# Patient Record
Sex: Male | Born: 1945 | Race: White | Hispanic: No | Marital: Married | Smoking: Former smoker
Health system: Southern US, Community
[De-identification: ages and names within clinical notes are randomized; demographics above are authoritative.]

## PROBLEM LIST (undated history)

## (undated) DIAGNOSIS — K5730 Diverticulosis of large intestine without perforation or abscess without bleeding: Secondary | ICD-10-CM

## (undated) DIAGNOSIS — I10 Essential (primary) hypertension: Secondary | ICD-10-CM

## (undated) DIAGNOSIS — N189 Chronic kidney disease, unspecified: Secondary | ICD-10-CM

## (undated) HISTORY — DX: Chronic kidney disease, unspecified: N18.9

## (undated) HISTORY — PX: CYSTOSCOPY: SUR368

## (undated) HISTORY — DX: Diverticulosis of large intestine without perforation or abscess without bleeding: K57.30

## (undated) HISTORY — DX: Essential (primary) hypertension: I10

---
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
## 2001-12-19 ENCOUNTER — Encounter: Payer: Self-pay | Admitting: Internal Medicine

## 2001-12-19 LAB — CONVERTED CEMR LAB

## 2002-11-11 ENCOUNTER — Encounter: Payer: Self-pay | Admitting: Internal Medicine

## 2002-12-26 ENCOUNTER — Encounter: Payer: Self-pay | Admitting: Internal Medicine

## 2005-05-25 ENCOUNTER — Ambulatory Visit: Payer: Self-pay | Admitting: Internal Medicine

## 2006-04-06 ENCOUNTER — Ambulatory Visit: Payer: Self-pay | Admitting: Internal Medicine

## 2006-06-28 ENCOUNTER — Ambulatory Visit: Payer: Self-pay | Admitting: Internal Medicine

## 2007-06-12 ENCOUNTER — Ambulatory Visit: Payer: Self-pay | Admitting: Internal Medicine

## 2007-07-31 ENCOUNTER — Encounter: Payer: Self-pay | Admitting: Internal Medicine

## 2007-07-31 DIAGNOSIS — K5730 Diverticulosis of large intestine without perforation or abscess without bleeding: Secondary | ICD-10-CM | POA: Insufficient documentation

## 2007-07-31 DIAGNOSIS — I10 Essential (primary) hypertension: Secondary | ICD-10-CM

## 2007-07-31 DIAGNOSIS — Z87442 Personal history of urinary calculi: Secondary | ICD-10-CM

## 2008-10-31 ENCOUNTER — Ambulatory Visit: Payer: Self-pay | Admitting: Internal Medicine

## 2008-10-31 LAB — CONVERTED CEMR LAB
ALT: 24 units/L (ref 0–53)
AST: 21 units/L (ref 0–37)
Albumin: 3.9 g/dL (ref 3.5–5.2)
BUN: 10 mg/dL (ref 6–23)
Basophils Absolute: 0.1 10*3/uL (ref 0.0–0.1)
Basophils Relative: 1 % (ref 0.0–3.0)
Bilirubin Urine: NEGATIVE
CO2: 29 meq/L (ref 19–32)
Calcium: 9.5 mg/dL (ref 8.4–10.5)
Cholesterol: 187 mg/dL (ref 0–200)
Creatinine, Ser: 0.8 mg/dL (ref 0.4–1.5)
Eosinophils Absolute: 0.5 10*3/uL (ref 0.0–0.7)
Eosinophils Relative: 5.3 % — ABNORMAL HIGH (ref 0.0–5.0)
Glucose, Urine, Semiquant: NEGATIVE
Hemoglobin: 15.2 g/dL (ref 13.0–17.0)
MCHC: 35.8 g/dL (ref 30.0–36.0)
MCV: 91.3 fL (ref 78.0–100.0)
Neutro Abs: 4.4 10*3/uL (ref 1.4–7.7)
PSA: 3.21 ng/mL (ref 0.10–4.00)
RBC: 4.64 M/uL (ref 4.22–5.81)
Specific Gravity, Urine: 1.025
TSH: 0.91 microintl units/mL (ref 0.35–5.50)
Total Bilirubin: 1.3 mg/dL — ABNORMAL HIGH (ref 0.3–1.2)
WBC: 8.6 10*3/uL (ref 4.5–10.5)
pH: 5.5

## 2008-11-05 ENCOUNTER — Ambulatory Visit: Payer: Self-pay | Admitting: Internal Medicine

## 2009-07-30 ENCOUNTER — Ambulatory Visit: Payer: Self-pay | Admitting: Internal Medicine

## 2009-12-17 ENCOUNTER — Ambulatory Visit: Payer: Self-pay | Admitting: Internal Medicine

## 2009-12-17 LAB — CONVERTED CEMR LAB
ALT: 23 units/L (ref 0–53)
Albumin: 3.6 g/dL (ref 3.5–5.2)
Basophils Relative: 1.3 % (ref 0.0–3.0)
Bilirubin, Direct: 0 mg/dL (ref 0.0–0.3)
CO2: 26 meq/L (ref 19–32)
Chloride: 102 meq/L (ref 96–112)
Creatinine, Ser: 0.8 mg/dL (ref 0.4–1.5)
Eosinophils Absolute: 0.5 10*3/uL (ref 0.0–0.7)
Eosinophils Relative: 5.4 % — ABNORMAL HIGH (ref 0.0–5.0)
HCT: 46 % (ref 39.0–52.0)
Hemoglobin: 15.4 g/dL (ref 13.0–17.0)
Ketones, urine, test strip: NEGATIVE
LDL Cholesterol: 109 mg/dL — ABNORMAL HIGH (ref 0–99)
MCHC: 33.5 g/dL (ref 30.0–36.0)
MCV: 97 fL (ref 78.0–100.0)
Monocytes Absolute: 1 10*3/uL (ref 0.1–1.0)
Neutro Abs: 5.2 10*3/uL (ref 1.4–7.7)
Neutrophils Relative %: 58.1 % (ref 43.0–77.0)
Nitrite: NEGATIVE
PSA: 2.85 ng/mL (ref 0.10–4.00)
Potassium: 4.1 meq/L (ref 3.5–5.1)
RBC: 4.74 M/uL (ref 4.22–5.81)
Sodium: 137 meq/L (ref 135–145)
Specific Gravity, Urine: 1.015
Total CHOL/HDL Ratio: 3
Total Protein: 6.7 g/dL (ref 6.0–8.3)
Triglycerides: 79 mg/dL (ref 0.0–149.0)
Urobilinogen, UA: 1
WBC Urine, dipstick: NEGATIVE
WBC: 9 10*3/uL (ref 4.5–10.5)

## 2009-12-25 ENCOUNTER — Ambulatory Visit: Payer: Self-pay | Admitting: Internal Medicine

## 2009-12-25 DIAGNOSIS — F17200 Nicotine dependence, unspecified, uncomplicated: Secondary | ICD-10-CM | POA: Insufficient documentation

## 2011-01-18 NOTE — Assessment & Plan Note (Signed)
Summary: cpx/njr pt rsc/njr   Vital Signs:  Patient profile:   65 year old male Height:      70 inches Weight:      216 pounds BMI:     31.10 Pulse rate:   76 / minute Resp:     12 per minute BP sitting:   120 / 84  (left arm)  Vitals Entered By: Gladis Riffle, RN (December 25, 2009 9:09 AM)   History of Present Illness: cpx  Preventive Screening-Counseling & Management  Alcohol-Tobacco     Smoking Status: current     Packs/Day: 0.5  Current Problems (verified): 1)  Preventive Health Care  (ICD-V70.0) 2)  Nephrolithiasis, Hx of  (ICD-V13.01) 3)  Hypertension  (ICD-401.9) 4)  Diverticulosis, Colon  (ICD-562.10)  Current Medications (verified): 1)  Lisinopril-Hydrochlorothiazide 20-25 Mg Tabs (Lisinopril-Hydrochlorothiazide) .... Take 1 Tablet By Mouth Once A Day 2)  Saw Palmetto 160 Mg  Caps (Saw Palmetto (Serenoa Repens)) .... Once Daily 3)  Aspirin 81 Mg  Tbec (Aspirin) .... Once Daily 4)  Multivitamins   Caps (Multiple Vitamin) .... Once Daily  Allergies (verified): No Known Drug Allergies  Comments:  Nurse/Medical Assistant: cpx, labs done--needs refill   The patient's medications and allergies were reviewed with the patient and were updated in the Medication and Allergy Lists. Gladis Riffle, RN (December 25, 2009 9:10 AM)  Past History:  Past Medical History: Last updated: 07/31/2007 Diverticulosis, colon Hypertension Nephrolithiasis, hx of  Past Surgical History: Last updated: 07/31/2007 Tonsillectomy, adenoidectomy cystoscopy  Family History: Last updated: 2008/12/02 father deceased--alcohol and cardiac problems  Social History: Last updated: 12-02-08 Occupation: Married Current Smoker Alcohol use-yes Regular exercise-no  Risk Factors: Exercise: no (December 02, 2008)  Risk Factors: Smoking Status: current (12/25/2009) Packs/Day: 0.5 (12/25/2009)  Social History: Packs/Day:  0.5  Review of Systems       All other systems reviewed and were  negative    Impression & Recommendations:  Problem # 1:  PREVENTIVE HEALTH CARE (ICD-V70.0) health maint utd  Problem # 2:  TOBACCO USE (ICD-305.1)  Encouraged smoking cessation and discussed different methods for smoking cessation.  medical information given  Problem # 3:  HYPERTENSION (ICD-401.9) controlled continue current medications  His updated medication list for this problem includes:    Lisinopril-hydrochlorothiazide 20-25 Mg Tabs (Lisinopril-hydrochlorothiazide) .Marland Kitchen... Take 1 tablet by mouth once a day  BP today: 120/84 Prior BP: 128/86 (07/30/2009)  Labs Reviewed: K+: 4.1 (12/17/2009) Creat: : 0.8 (12/17/2009)   Chol: 185 (12/17/2009)   HDL: 60.60 (12/17/2009)   LDL: 109 (12/17/2009)   TG: 79.0 (12/17/2009)  Complete Medication List: 1)  Lisinopril-hydrochlorothiazide 20-25 Mg Tabs (Lisinopril-hydrochlorothiazide) .... Take 1 tablet by mouth once a day 2)  Saw Palmetto 160 Mg Caps (Saw palmetto (serenoa repens)) .... Once daily 3)  Aspirin 81 Mg Tbec (Aspirin) .... Once daily 4)  Multivitamins Caps (Multiple vitamin) .... Once daily  Preventive Care Screening  Colonoscopy:    Next Due:  01/2013  Appended Document: Orders Update    Clinical Lists Changes  Problems: Added new problem of SPECIAL SCREENING FOR MALIGNANT NEOPLASMS COLON (ICD-V76.51) Orders: Added new Referral order of Gastroenterology Referral (GI) - Signed

## 2011-02-22 ENCOUNTER — Other Ambulatory Visit: Payer: Self-pay | Admitting: Internal Medicine

## 2011-04-05 ENCOUNTER — Other Ambulatory Visit (INDEPENDENT_AMBULATORY_CARE_PROVIDER_SITE_OTHER): Payer: Managed Care, Other (non HMO) | Admitting: Internal Medicine

## 2011-04-05 DIAGNOSIS — Z0000 Encounter for general adult medical examination without abnormal findings: Secondary | ICD-10-CM

## 2011-04-05 LAB — HEPATIC FUNCTION PANEL
ALT: 20 U/L (ref 0–53)
AST: 22 U/L (ref 0–37)
Bilirubin, Direct: 0.2 mg/dL (ref 0.0–0.3)
Total Bilirubin: 0.9 mg/dL (ref 0.3–1.2)

## 2011-04-05 LAB — POCT URINALYSIS DIPSTICK
Leukocytes, UA: NEGATIVE
Nitrite, UA: NEGATIVE
Protein, UA: NEGATIVE
Urobilinogen, UA: 1

## 2011-04-05 LAB — PSA: PSA: 2.47 ng/mL (ref 0.10–4.00)

## 2011-04-05 LAB — LIPID PANEL
Cholesterol: 185 mg/dL (ref 0–200)
LDL Cholesterol: 104 mg/dL — ABNORMAL HIGH (ref 0–99)
Total CHOL/HDL Ratio: 3

## 2011-04-05 LAB — BASIC METABOLIC PANEL
CO2: 30 mEq/L (ref 19–32)
Calcium: 9.5 mg/dL (ref 8.4–10.5)
Creatinine, Ser: 0.9 mg/dL (ref 0.4–1.5)
GFR: 91.44 mL/min (ref >60.00)
Glucose, Bld: 98 mg/dL (ref 70–99)

## 2011-04-05 LAB — CBC WITH DIFFERENTIAL/PLATELET
Basophils Absolute: 0.1 10*3/uL (ref 0.0–0.1)
Eosinophils Absolute: 0.3 10*3/uL (ref 0.0–0.7)
Lymphocytes Relative: 20.3 % (ref 12.0–46.0)
MCHC: 34.6 g/dL (ref 30.0–36.0)
Monocytes Relative: 8.5 % (ref 3.0–12.0)
Neutrophils Relative %: 67.5 % (ref 43.0–77.0)
RBC: 4.92 Mil/uL (ref 4.22–5.81)
RDW: 13.4 % (ref 11.5–14.6)

## 2011-04-11 ENCOUNTER — Encounter: Payer: Self-pay | Admitting: Internal Medicine

## 2011-04-12 ENCOUNTER — Encounter: Payer: Self-pay | Admitting: Internal Medicine

## 2011-04-12 ENCOUNTER — Ambulatory Visit (INDEPENDENT_AMBULATORY_CARE_PROVIDER_SITE_OTHER): Payer: Managed Care, Other (non HMO) | Admitting: Internal Medicine

## 2011-04-12 DIAGNOSIS — I10 Essential (primary) hypertension: Secondary | ICD-10-CM

## 2011-04-12 DIAGNOSIS — Z2911 Encounter for prophylactic immunotherapy for respiratory syncytial virus (RSV): Secondary | ICD-10-CM

## 2011-04-12 DIAGNOSIS — F17200 Nicotine dependence, unspecified, uncomplicated: Secondary | ICD-10-CM

## 2011-04-12 MED ORDER — VARENICLINE TARTRATE 0.5 MG X 11 & 1 MG X 42 PO MISC: ORAL | Status: AC

## 2011-04-12 MED ORDER — VARENICLINE TARTRATE 1 MG PO TABS: 1.0000 mg | ORAL_TABLET | Freq: Two times a day (BID) | ORAL | Status: AC

## 2011-04-12 NOTE — Progress Notes (Signed)
  Subjective:    Patient ID: Trevor [name], male    DOB: 08-May-1946, 65 y.o.   MRN: [id number]  HPI cpx  Past Medical History  Diagnosis Date  . Diverticulosis of colon   . Hypertension   . Chronic kidney disease     kidney stone   Past Surgical History  Procedure Date  . Tonsilectomy, adenoidectomy, bilateral myringotomy and tubes   . Cystoscopy     reports that he has been smoking Cigarettes.  He has been smoking about .5 packs per day. He does not have any smokeless tobacco history on file. He reports that he drinks alcohol. He reports that he does not use illicit drugs. family history includes Alcohol abuse in his father; Heart disease in his father; Hyperlipidemia in his mother; and Osteoporosis in his mother. No Known Allergies   Review of Systems  patient denies chest pain, shortness of breath, orthopnea. Denies lower extremity edema, abdominal pain, change in appetite, change in bowel movements. Patient denies rashes, musculoskeletal complaints. No other specific complaints in a complete review of systems.      Objective:   Physical Exam Well-developed male in no acute distress. HEENT exam atraumatic, normocephalic, extraocular muscles are intact. Conjunctivae are pink without exudate. Neck is supple without lymphadenopathy, thyromegaly, jugular venous distention. Chest is clear to auscultation without increased work of breathing. Cardiac exam S1-S2 are regular. The PMI is normal. No significant murmurs or gallops. Abdominal exam active bowel sounds, soft, nontender. No abdominal bruits. Extremities no clubbing cyanosis or edema. Peripheral pulses are normal without bruits. Neurologic exam alert and oriented without any motor or sensory deficits. Rectal exam normal tone prostate normal size without masses or asymmetry.     Assessment & Plan:  Well Visit---health maint utd

## 2011-04-12 NOTE — Patient Instructions (Signed)
Smoking Cessation This document explains the best ways for you to quit smoking and new treatments to help. It lists new medicines that can double or triple your chances of quitting and quitting for good. It also considers ways to avoid relapses and concerns you may have about quitting, including weight gain. NICOTINE: A POWERFUL ADDICTION If you have tried to quit smoking, you know how hard it can be. It is hard because nicotine is a very addictive drug. For some people, it can be as addictive as heroin or cocaine. Usually, people make 2 or 3 tries, or more, before finally being able to quit. Each time you try to quit, you can learn about what helps and what hurts. Quitting takes hard work and a lot of effort, but you can quit smoking. QUITTING SMOKING IS ONE OF THE MOST IMPORTANT THINGS YOU WILL EVER DO:  You will live longer, feel better, and live better.   The impact on your body of quitting smoking is felt almost immediately:   Within 20 minutes, blood pressure decreases. Pulse returns to its normal level.   After 8 hours, carbon monoxide levels in the blood return to normal. Oxygen level increases.   After 24 hours, chance of heart attack starts to decrease. Breath, hair, and body stop smelling like smoke.   After 48 hours, damaged nerve endings begin to recover. Sense of taste and smell improve.   After 72 hours, the body is virtually free of nicotine. Bronchial tubes relax and breathing becomes easier.   After 2 to 12 weeks, lungs can hold more air. Exercise becomes easier and circulation improves.   Quitting will lower your chance of having a heart attack, stroke, cancer, or lung disease:   After 1 year, the risk of coronary heart disease is cut in half.   After 5 years, the risk of stroke falls to the same as a nonsmoker.   After 10 years, the risk of lung cancer is cut in half and the risk of other cancers decreases significantly.   After 15 years, the risk of coronary heart  disease drops, usually to the level of a nonsmoker.   If you are pregnant, quitting smoking will improve your chances of having a healthy baby.   The people you live with, especially your children, will be healthier.   You will have extra money to spend on things other than cigarettes.  FIVE KEYS TO QUITTING Studies have shown that these 5 steps will help you quit smoking and quit for good. You have the best chances of quitting if you use them together: 1. Get ready.  2. Get support and encouragement.  3. Learn new skills and behaviors.  4. Get medicine to reduce your nicotine addiction and use it correctly.  5. Be prepared for relapse or difficult situations. Be determined to continue trying to quit, even if you do not succeed at first.  1. GET READY  Set a quit date.   Change your environment.   Get rid of ALL cigarettes, ashtrays, matches, and lighters in your home, car, and place of work.   Do not let people smoke in your home.   Review your past attempts to quit. Think about what worked and what did not.   Once you quit, do not smoke. NOT EVEN A PUFF!  2. GET SUPPORT AND ENCOURAGEMENT Studies have shown that you have a better chance of being successful if you have help. You can get support in many ways.  Tell   your family, friends, and coworkers that you are going to quit and need their support. Ask them not to smoke around you.   Talk to your caregivers (doctor, dentist, nurse, pharmacist, psychologist, and/or smoking counselor).   Get individual, group, or telephone counseling and support. The more counseling you have, the better your chances are of quitting. Programs are available at local hospitals and health centers. Call your local health department for information about programs in your area.   Spiritual beliefs and practices may help some smokers quit.   Quit meters are small computer programs online or downloadable that keep track of quit statistics, such as amount  of "quit-time," cigarettes not smoked, and money saved.   Many smokers find one or more of the many self-help books available useful in helping them quit and stay off tobacco.  3. LEARN NEW SKILLS AND BEHAVIORS  Try to distract yourself from urges to smoke. Talk to someone, go for a walk, or occupy your time with a task.   When you first try to quit, change your routine. Take a different route to work. Drink tea instead of coffee. Eat breakfast in a different place.   Do something to reduce your stress. Take a hot bath, exercise, or read a book.   Plan something enjoyable to do every day. Reward yourself for not smoking.   Explore interactive web-based programs that specialize in helping you quit.  4. GET MEDICINE AND USE IT CORRECTLY Medicines can help you stop smoking and decrease the urge to smoke. Combining medicine with the above behavioral methods and support can quadruple your chances of successfully quitting smoking. The [place] Food and Drug Administration (FDA) has approved 7 medicines to help you quit smoking. These medicines fall into 3 categories.  Nicotine replacement therapy (delivers nicotine to your body without the negative effects and risks of smoking):   Nicotine gum: Available over-the-counter.   Nicotine lozenges: Available over-the-counter.   Nicotine inhaler: Available by prescription.   Nicotine nasal spray: Available by prescription.   Nicotine skin patches (transdermal): Available by prescription and over-the-counter.   Antidepressant medicine (helps people abstain from smoking, but how this works is unknown):   Bupropion sustained-release (SR) tablets: Available by prescription.   Nicotinic receptor partial agonist (simulates the effect of nicotine in your brain):   Varenicline tartrate tablets: Available by prescription.   Ask your caregiver for advice about which medicines to use and how to use them. Carefully read the information on the package.    Everyone who is trying to quit may benefit from using a medicine. If you are pregnant or trying to become pregnant, nursing an infant, you are under age 18, or you smoke fewer than 10 cigarettes per day, talk to your caregiver before taking any nicotine replacement medicines.   You should stop using a nicotine replacement product and call your caregiver if you experience nausea, dizziness, weakness, vomiting, fast or irregular heartbeat, mouth problems with the lozenge or gum, or redness or swelling of the skin around the patch that does not go away.   Do not use any other product containing nicotine while using a nicotine replacement product.   Talk to your caregiver before using these products if you have diabetes, heart disease, asthma, stomach ulcers, you had a recent heart attack, you have high blood pressure that is not controlled with medicine, a history of irregular heartbeat, or you have been prescribed medicine to help you quit smoking.  5. BE PREPARED FOR RELAPSE OR   DIFFICULT SITUATIONS  Most relapses occur within the first 3 months after quitting. Do not be discouraged if you start smoking again. Remember, most people try several times before they finally quit.   You may have symptoms of withdrawal because your body is used to nicotine. You may crave cigarettes, be irritable, feel very hungry, cough often, get headaches, or have difficulty concentrating.   The withdrawal symptoms are only temporary. They are strongest when you first quit, but they will go away within 10 to 14 days.  Here are some difficult situations to watch for:  Alcohol. Avoid drinking alcohol. Drinking lowers your chances of successfully quitting.   Caffeine. Try to reduce the amount of caffeine you consume. It also lowers your chances of successfully quitting.   Other smokers. Being around smoking can make you want to smoke. Avoid smokers.   Weight gain. Many smokers will gain weight when they quit, usually  less than 10 pounds. Eat a healthy diet and stay active. Do not let weight gain distract you from your main goal, quitting smoking. Some medicines that help you quit smoking may also help delay weight gain. You can always lose the weight gained after you quit.   Bad mood or depression. There are a lot of ways to improve your mood other than smoking.  If you are having problems with any of these situations, talk to your caregiver. SPECIAL SITUATIONS OR CONDITIONS Studies suggest that everyone can quit smoking. Your situation or condition can give you a special reason to quit.  Pregnant women/New mothers: By quitting, you protect your baby's health and your own.   Hospitalized patients: By quitting, you reduce health problems and help healing.   Heart attack patients: By quitting, you reduce your risk of a second heart attack.   Lung, head, and neck cancer patients: By quitting, you reduce your chance of a second cancer.   Parents of children and adolescents: By quitting, you protect your children from illnesses caused by secondhand smoke.  QUESTIONS TO THINK ABOUT Think about the following questions before you try to stop smoking. You may want to talk about your answers with your caregiver.  Why do you want to quit?   If you tried to quit in the past, what helped and what did not?   What will be the most difficult situations for you after you quit? How will you plan to handle them?   Who can help you through the tough times? Your family? Friends? Caregiver?   What pleasures do you get from smoking? What ways can you still get pleasure if you quit?  Here are some questions to ask your caregiver:  How can you help me to be successful at quitting?   What medicine do you think would be best for me and how should I take it?   What should I do if I need more help?   What is smoking withdrawal like? How can I get information on withdrawal?  Quitting takes hard work and a lot of effort,  but you can quit smoking. FOR MORE INFORMATION Smokefree.gov (http://www.smokefree.gov) provides free, accurate, evidence-based information and professional assistance to help support the immediate and long-term needs of people trying to quit smoking. Document Released: 11/29/2001 Document Re-Released: 05/25/2010 [employer] Patient Information 2011 [employer]. 

## 2011-04-12 NOTE — Assessment & Plan Note (Signed)
Controlled Continue same meds 

## 2011-04-12 NOTE — Assessment & Plan Note (Signed)
Discussed need for cessation Information given regarding chantix Rx given

## 2012-03-15 ENCOUNTER — Other Ambulatory Visit: Payer: Self-pay | Admitting: Internal Medicine

## 2012-04-10 ENCOUNTER — Other Ambulatory Visit (INDEPENDENT_AMBULATORY_CARE_PROVIDER_SITE_OTHER): Payer: Managed Care, Other (non HMO)

## 2012-04-10 DIAGNOSIS — Z0000 Encounter for general adult medical examination without abnormal findings: Secondary | ICD-10-CM

## 2012-04-10 LAB — POCT URINALYSIS DIPSTICK
Glucose, UA: NEGATIVE
Leukocytes, UA: NEGATIVE
Nitrite, UA: NEGATIVE

## 2012-04-10 LAB — CBC WITH DIFFERENTIAL/PLATELET
Basophils Relative: 0.6 % (ref 0.0–3.0)
Eosinophils Absolute: 0.3 10*3/uL (ref 0.0–0.7)
Hemoglobin: 16.1 g/dL (ref 13.0–17.0)
Lymphocytes Relative: 18.2 % (ref 12.0–46.0)
MCHC: 33.5 g/dL (ref 30.0–36.0)
MCV: 96.1 fl (ref 78.0–100.0)
Monocytes Absolute: 1 10*3/uL (ref 0.1–1.0)
Neutro Abs: 9.3 10*3/uL — ABNORMAL HIGH (ref 1.4–7.7)
RBC: 5 Mil/uL (ref 4.22–5.81)

## 2012-04-10 LAB — BASIC METABOLIC PANEL
CO2: 28 mEq/L (ref 19–32)
Chloride: 97 mEq/L (ref 96–112)
Sodium: 136 mEq/L (ref 135–145)

## 2012-04-10 LAB — HEPATIC FUNCTION PANEL
Albumin: 4.2 g/dL (ref 3.5–5.2)
Alkaline Phosphatase: 67 U/L (ref 39–117)
Total Protein: 6.8 g/dL (ref 6.0–8.3)

## 2012-04-10 LAB — LIPID PANEL
Total CHOL/HDL Ratio: 3
Triglycerides: 83 mg/dL (ref 0.0–149.0)

## 2012-04-10 LAB — PSA: PSA: 3.38 ng/mL (ref 0.10–4.00)

## 2012-04-17 ENCOUNTER — Encounter: Payer: Managed Care, Other (non HMO) | Admitting: Internal Medicine

## 2012-04-18 ENCOUNTER — Ambulatory Visit (INDEPENDENT_AMBULATORY_CARE_PROVIDER_SITE_OTHER)
Admission: RE | Admit: 2012-04-18 | Discharge: 2012-04-18 | Disposition: A | Discharge status: Home / Self Care | Payer: Managed Care, Other (non HMO) | Source: Ambulatory Visit | Attending: Internal Medicine | Admitting: Internal Medicine

## 2012-04-18 ENCOUNTER — Ambulatory Visit (INDEPENDENT_AMBULATORY_CARE_PROVIDER_SITE_OTHER): Payer: Managed Care, Other (non HMO) | Admitting: Internal Medicine

## 2012-04-18 ENCOUNTER — Encounter: Payer: Self-pay | Admitting: Internal Medicine

## 2012-04-18 VITALS — BP 154/98 | HR 88 | Temp 98.1°F | Ht 70.0 in | Wt 198.0 lb

## 2012-04-18 DIAGNOSIS — R634 Abnormal weight loss: Secondary | ICD-10-CM

## 2012-04-18 DIAGNOSIS — Z23 Encounter for immunization: Secondary | ICD-10-CM

## 2012-04-18 DIAGNOSIS — D72829 Elevated white blood cell count, unspecified: Secondary | ICD-10-CM

## 2012-04-18 DIAGNOSIS — Z0000 Encounter for general adult medical examination without abnormal findings: Secondary | ICD-10-CM

## 2012-04-18 DIAGNOSIS — E8881 Metabolic syndrome: Secondary | ICD-10-CM

## 2012-04-18 HISTORY — PX: CATARACT EXTRACTION W/ INTRAOCULAR LENS IMPLANT: SHX1309

## 2012-04-18 LAB — HIGH SENSITIVITY CRP: CRP, High Sensitivity: <0.2 mg/L (ref 0.000–5.000)

## 2012-04-18 LAB — SEDIMENTATION RATE: Sed Rate: 3 mm/hr (ref 0–22)

## 2012-04-18 MED ORDER — LISINOPRIL-HYDROCHLOROTHIAZIDE 20-25 MG PO TABS: 1.0000 | ORAL_TABLET | Freq: Every day | ORAL | Status: DC

## 2012-04-18 NOTE — Progress Notes (Signed)
Patient ID: Trevor [name], male   DOB: 05-Sep-1946, 66 y.o.   MRN: [id number] cpx  New complaint--- one year hx of decreased appetite-- no specific pain or other sxs with eating. Food can be "gagging". He also notes that he has 2-3 bms per day and they are typically "loose". This has been a change over the past 1 year as well.  Past Medical History  Diagnosis Date  . Diverticulosis of colon   . Hypertension   . Chronic kidney disease     kidney stone    History   Social History  . Marital Status: Married    Spouse Name: N/A    Number of Children: N/A  . Years of Education: N/A   Occupational History  . Not on file.   Social History Main Topics  . Smoking status: Current Everyday Smoker -- 0.5 packs/day    Types: Cigarettes  . Smokeless tobacco: Not on file  . Alcohol Use: Yes  . Drug Use: No  . Sexually Active:    Other Topics Concern  . Not on file   Social History Narrative  . No narrative on file    Past Surgical History  Procedure Date  . Tonsilectomy, adenoidectomy, bilateral myringotomy and tubes   . Cystoscopy     Family History  Problem Relation Age of Onset  . Alcohol abuse Father   . Heart disease Father   . Hyperlipidemia Mother   . Osteoporosis Mother     No Known Allergies  Current Outpatient Prescriptions on File Prior to Visit  Medication Sig Dispense Refill  . aspirin 81 MG tablet Take 81 mg by mouth daily.        Marland Kitchen lisinopril-hydrochlorothiazide (PRINZIDE,ZESTORETIC) 20-25 MG per tablet take 1 tablet by mouth once daily  90 tablet  11  . Multiple Vitamin (MULTIVITAMIN) tablet Take 1 tablet by mouth daily.        . saw palmetto 160 MG capsule Take 160 mg by mouth 2 (two) times daily.           patient denies chest pain, shortness of breath, orthopnea. Denies lower extremity edema, abdominal pain, change in appetite, change in bowel movements. Patient denies rashes, musculoskeletal complaints. No other specific complaints in a complete  review of systems.   BP 154/98  Pulse 88  Temp(Src) 98.1 F (36.7 C) (Oral)  Ht 5\' 10"  (1.778 m)  Wt 198 lb (89.812 kg)  BMI 28.41 kg/m2  well-developed well-nourished male in no acute distress. HEENT exam atraumatic, normocephalic, neck supple without jugular venous distention. Chest clear to auscultation cardiac exam S1-S2 are regular. Abdominal exam overweight with bowel sounds, soft and nontender. Extremities no edema. Neurologic exam is alert with a normal gait.   Assessment and plan: Well visit, health maintenance up-to-date.

## 2012-04-20 ENCOUNTER — Other Ambulatory Visit: Payer: Self-pay | Admitting: Internal Medicine

## 2012-04-20 DIAGNOSIS — R634 Abnormal weight loss: Secondary | ICD-10-CM

## 2012-05-02 ENCOUNTER — Telehealth: Payer: Self-pay | Admitting: Gastroenterology

## 2012-05-02 NOTE — Telephone Encounter (Signed)
Pt states that he has been loosing weight for quite some time and has no appetite. States he saw Dr. Cato Mulligan and was supposed to be set up for a colon asap. Pt is due for colon recall in Jan of 2014. Pt scheduled to see Mike Gip PA tomorrow at 11am. Pt aware of appt date and time.

## 2012-05-03 ENCOUNTER — Telehealth: Payer: Self-pay | Admitting: Internal Medicine

## 2012-05-03 ENCOUNTER — Encounter: Payer: Self-pay | Admitting: Physician Assistant

## 2012-05-03 ENCOUNTER — Ambulatory Visit (INDEPENDENT_AMBULATORY_CARE_PROVIDER_SITE_OTHER): Payer: Managed Care, Other (non HMO) | Admitting: Physician Assistant

## 2012-05-03 VITALS — BP 110/80 | HR 75 | Ht 71.0 in | Wt 189.4 lb

## 2012-05-03 DIAGNOSIS — R194 Change in bowel habit: Secondary | ICD-10-CM

## 2012-05-03 DIAGNOSIS — R198 Other specified symptoms and signs involving the digestive system and abdomen: Secondary | ICD-10-CM

## 2012-05-03 DIAGNOSIS — R634 Abnormal weight loss: Secondary | ICD-10-CM

## 2012-05-03 DIAGNOSIS — R6881 Early satiety: Secondary | ICD-10-CM

## 2012-05-03 MED ORDER — LISINOPRIL-HYDROCHLOROTHIAZIDE 20-25 MG PO TABS: 1.0000 | ORAL_TABLET | Freq: Every day | ORAL | Status: DC

## 2012-05-03 NOTE — Progress Notes (Signed)
Subjective:    Patient ID: Trevor [name], male    DOB: 1946-04-25, 66 y.o.   MRN: [id number]  HPI Trevor [name] is a pleasant 66 year old white male known to Dr. Terrial Rhodes previously on colonoscopy done in 2004. This showed sigmoid diverticulosis but was otherwise negative exam. Patient does have history of hypertension and has COPD noted on recent chest x-ray. He has been otherwise generally healthy .  Patient comes in today referred by Dr. Cato Mulligan for evaluation of weight loss.. Patient relates that he is lost about 28 pounds over the past year. He feels that the weight loss started early last fall. He is also noted a change in his bowel habits with loose her stools intermittently over the past 6-7 months. He denies having diarrhea has not noted any melena or hematochezia. He says his appetite has been poor of note he still gets hungry. He says he starts eating and just does not want any more. He says there are several foods that he used to like that he just now had has no appetite for at this point. He denies any dysphagia or odynophagia, has not had any nausea or vomiting, he does have occasional heartburn. He has not noted any abdominal pain. Overall he says he feels mildly fatigued. He has No complaint of night sweats. He denies any arthralgias, myalgias or urinary symptoms. He is a smoker.  Patient has had recent labs done in April of 2013 oh WBC was 13,000 hemoglobin was 16 TSH 0.85 and seem that completely unremarkable. He has not had any other imaging.  Review of Systems  Constitutional: Positive for appetite change and fatigue.  HENT: Negative.   Eyes: Negative.   Respiratory: Negative.   Cardiovascular: Negative.   Gastrointestinal: Positive for diarrhea.  Genitourinary: Negative.   Musculoskeletal: Negative.   Skin: Negative.   Neurological: Negative.   Hematological: Negative.   Psychiatric/Behavioral: Negative.    Outpatient Prescriptions Prior to Visit  Medication Sig Dispense  Refill  . aspirin 81 MG tablet Take 81 mg by mouth daily.        Marland Kitchen lisinopril-hydrochlorothiazide (PRINZIDE,ZESTORETIC) 20-25 MG per tablet Take 1 tablet by mouth daily.  90 tablet  3  . Multiple Vitamin (MULTIVITAMIN) tablet Take 1 tablet by mouth daily.        . saw palmetto 160 MG capsule Take 160 mg by mouth 2 (two) times daily.         No Known Allergies     Patient Active Problem List  Diagnoses  . TOBACCO USE  . HYPERTENSION  . DIVERTICULOSIS, COLON  . NEPHROLITHIASIS, HX OF  . Metabolic syndrome X   History   Social History  . Marital Status: Married    Spouse Name: N/A    Number of Children: 5  . Years of Education: N/A   Occupational History  . retired    Social History Main Topics  . Smoking status: Current Everyday Smoker -- 0.5 packs/day for 45 years    Types: Cigarettes  . Smokeless tobacco: Never Used   Comment: Tobacco info given 05/03/12  . Alcohol Use: Yes     mod.  . Drug Use: No  . Sexually Active: Not on file   Other Topics Concern  . Not on file   Social History Narrative  . No narrative on file   Pertinent positive and negative review of systems were noted in the above HPI section.  All other review of systems was otherwise negative.  Objective:  Physical Exam well-developed white male in no acute distress, blood pressure 110/80 pulse 75 height 5 foot 11 weight 189. HEENT; nontraumatic normocephalic EOMI PERRLA sclera anicteric conjunctiva pink,neck; Supple no JVD no palpable cervical adenopathy. Cardiovascular; regular rate and rhythm with S1-S2 no murmur or gallop, Pulmonary; few scattered rhonchi, Abdomen; soft somewhat scaphoid is nontender there is no palpable mass or hepatosplenomegaly bowel sounds are active no bruit, Rectal; exam not done, Ext; no clubbing cyanosis or edema skin warm and dry, Psych mood and affect normal and appropriate.        Assessment & Plan:  #21 66 year old male with significant weight loss of 20 pounds over  the past one year and associated early satiety and anorexia. He has had a very nonspecific change in bowel habits with looser stools appear. He also complains of fatigue. Etiology of his symptoms is not clear, however am concerned about  an occult malignancy.. Rule out possible gastric lesion or pancreatic lesion. #2 cataracts-Asian is scheduled for cataract surgery 05/08/2012 #3 hypertension #4 diverticulosis #5 smoker Plan; scheduled for CT scan of the abdomen and pelvis next week. Patient wants to wait until after he recovers from his cataract surgery to proceed with any endoscopic workup and that is reasonable. Have discussed the need for colonoscopy and upper endoscopy. Procedures discussed in detail with the patient and he is agreeable to proceed. We will schedule with Dr. Marina Goodell within the next few weeks.

## 2012-05-03 NOTE — Progress Notes (Signed)
Case discussed with physician assistant. Agree with the assessment and plans as outlined. Amy to followup on CT scan results

## 2012-05-03 NOTE — Telephone Encounter (Signed)
NOtified pt's wife and she states [facility] wanted [place] to call rx in as they just activated his account. Rx called to [facility], group # [id number].

## 2012-05-03 NOTE — Telephone Encounter (Signed)
Refill sent to [facility] per 04/18/12 documentation #90 x 3 refills.

## 2012-05-03 NOTE — Telephone Encounter (Signed)
Spoke with Liborio Nixon at [facility] and was told that pt's account is inactive. Notified pt's wife, Sedalia Muta and she states she was told yesterday that he is active and no rxs have been received. Advised her to speak to [facility] again to "activate" pt's account and call us back if we need to re-send Rx.

## 2012-05-03 NOTE — Telephone Encounter (Signed)
Pt now uses [facility] lisinopril-hydrochlorothiazide (PRINZIDE,ZESTORETIC) 20-25 MG per tablet to [facility] fax # [phone].

## 2012-05-03 NOTE — Patient Instructions (Signed)
We will contact you regarding the colonoscopy.   We have scheduled the CT scan at [facility], [address].  You are scheduled on 05-15-2012 at 9:00 Am . You should arrive at 8:45 Am. Please follow the written instructions below on the day of your exam:  WARNING: IF YOU ARE ALLERGIC TO IODINE/X-RAY DYE, PLEASE NOTIFY RADIOLOGY IMMEDIATELY AT [phone]! YOU WILL BE GIVEN A 13 HOUR PREMEDICATION PREP.  1) Do not eat or drink anything after 5:00 Am  (4 hours prior to your test) 2) You have been given 2 bottles of oral contrast to drink. The solution may taste  better if refrigerated, but do NOT add ice or any other liquid to this solution. Shake well before drinking.    Drink 1 bottle of contrast @ 7:00 Am  (2 hours prior to your exam)  Drink 1 bottle of contrast @ 8:00 AM 1 hour prior to your exam)  You may take any medications as prescribed with a small amount of water except for the following: Metformin, Glucophage, Glucovance, Avandamet, Riomet, Fortamet, Actoplus Met, Janumet, Glumetza or Metaglip. The above medications must be held the day of the exam AND 48 hours after the exam.  The purpose of you drinking the oral contrast is to aid in the visualization of your intestinal tract. The contrast solution may cause some diarrhea. Before your exam is started, you will be given a small amount of fluid to drink. Depending on your individual set of symptoms, you may also receive an intravenous injection of x-ray contrast/dye. Plan on being at [facility] for 30 minutes or long, depending on the type of exam you are having performed.  If you have any questions regarding your exam or if you need to reschedule, you may call the CT department at [phone] between the hours of 8:00 am and 5:00 pm, Monday-Friday.  ________________________________________________________________________

## 2012-05-03 NOTE — Telephone Encounter (Signed)
Pt wife called back and just called [facility] and said that her husband acct is now active and the script call be called in to Phone # [phone] press Opition #2 and order the Lisinopril.

## 2012-05-09 ENCOUNTER — Telehealth: Payer: Self-pay | Admitting: Internal Medicine

## 2012-05-09 NOTE — Telephone Encounter (Signed)
I called the pt to apologize for the hospital calling him trying to confirm his procedure we originally scheduled for 05-18-2012.  We cancelled it in the office but I didn't cancel it at the hospital. He had a scheduling conflict with his wife and her work schedule.   He said his wife may be able to get off whatever day we schedule the procedure now.  I told him Amy said that we will see what the CT shows and then go from there for scheduling the colonoscopy/Endoscopy.

## 2012-05-09 NOTE — Telephone Encounter (Signed)
Pam please check on this and discuss with pt. He states he did not know anything about his procedure appt on 05/18/12 with Dr. Marina Goodell. Pt saw Amy Esterwood PA 05/03/12. I do not know about this pt.

## 2012-05-10 ENCOUNTER — Encounter (HOSPITAL_COMMUNITY): Admission: RE | Payer: Self-pay | Source: Ambulatory Visit

## 2012-05-10 SURGERY — EGD (ESOPHAGOGASTRODUODENOSCOPY)
Anesthesia: Moderate Sedation

## 2012-05-12 ENCOUNTER — Ambulatory Visit (HOSPITAL_COMMUNITY)
Admission: RE | Admit: 2012-05-12 | Payer: Managed Care, Other (non HMO) | Source: Ambulatory Visit | Admitting: Internal Medicine

## 2012-05-15 ENCOUNTER — Ambulatory Visit (INDEPENDENT_AMBULATORY_CARE_PROVIDER_SITE_OTHER)
Admission: RE | Admit: 2012-05-15 | Discharge: 2012-05-15 | Disposition: A | Discharge status: Home / Self Care | Payer: Managed Care, Other (non HMO) | Source: Ambulatory Visit | Attending: Physician Assistant | Admitting: Physician Assistant

## 2012-05-15 DIAGNOSIS — R634 Abnormal weight loss: Secondary | ICD-10-CM

## 2012-05-15 DIAGNOSIS — R194 Change in bowel habit: Secondary | ICD-10-CM

## 2012-05-15 DIAGNOSIS — R6881 Early satiety: Secondary | ICD-10-CM

## 2012-05-15 DIAGNOSIS — R198 Other specified symptoms and signs involving the digestive system and abdomen: Secondary | ICD-10-CM

## 2012-05-15 MED ORDER — IOHEXOL 300 MG/ML  SOLN
100.0000 mL | Freq: Once | INTRAMUSCULAR | Status: AC | PRN
  Administered 2012-05-15: 100 mL via INTRAVENOUS

## 2012-05-18 ENCOUNTER — Encounter (HOSPITAL_COMMUNITY): Admission: RE | Payer: Self-pay | Source: Ambulatory Visit

## 2012-05-18 SURGERY — EGD (ESOPHAGOGASTRODUODENOSCOPY)
Anesthesia: Moderate Sedation

## 2012-05-21 ENCOUNTER — Encounter: Payer: Managed Care, Other (non HMO) | Admitting: Internal Medicine

## 2012-05-31 ENCOUNTER — Ambulatory Visit (AMBULATORY_SURGERY_CENTER): Payer: Managed Care, Other (non HMO) | Admitting: *Deleted

## 2012-05-31 VITALS — Ht 71.0 in | Wt 190.0 lb

## 2012-05-31 DIAGNOSIS — R198 Other specified symptoms and signs involving the digestive system and abdomen: Secondary | ICD-10-CM

## 2012-05-31 DIAGNOSIS — R634 Abnormal weight loss: Secondary | ICD-10-CM

## 2012-05-31 DIAGNOSIS — Z1211 Encounter for screening for malignant neoplasm of colon: Secondary | ICD-10-CM

## 2012-05-31 MED ORDER — MOVIPREP 100 G PO SOLR: ORAL | Status: DC

## 2012-06-15 ENCOUNTER — Ambulatory Visit (AMBULATORY_SURGERY_CENTER): Payer: Managed Care, Other (non HMO) | Admitting: Internal Medicine

## 2012-06-15 ENCOUNTER — Encounter: Payer: Self-pay | Admitting: Internal Medicine

## 2012-06-15 VITALS — BP 130/93 | HR 82 | Temp 98.8°F | Resp 20 | Ht 71.0 in | Wt 190.0 lb

## 2012-06-15 DIAGNOSIS — K222 Esophageal obstruction: Secondary | ICD-10-CM

## 2012-06-15 DIAGNOSIS — D126 Benign neoplasm of colon, unspecified: Secondary | ICD-10-CM

## 2012-06-15 DIAGNOSIS — R634 Abnormal weight loss: Secondary | ICD-10-CM

## 2012-06-15 DIAGNOSIS — R198 Other specified symptoms and signs involving the digestive system and abdomen: Secondary | ICD-10-CM

## 2012-06-15 DIAGNOSIS — Z1211 Encounter for screening for malignant neoplasm of colon: Secondary | ICD-10-CM

## 2012-06-15 DIAGNOSIS — K5730 Diverticulosis of large intestine without perforation or abscess without bleeding: Secondary | ICD-10-CM

## 2012-06-15 DIAGNOSIS — K219 Gastro-esophageal reflux disease without esophagitis: Secondary | ICD-10-CM

## 2012-06-15 MED ORDER — SODIUM CHLORIDE 0.9 % IV SOLN: 500.0000 mL | INTRAVENOUS | Status: DC

## 2012-06-15 NOTE — Progress Notes (Signed)
Patient did not experience any of the following events: a burn prior to discharge; a fall within the facility; wrong site/side/patient/procedure/implant event; or a hospital transfer or hospital admission upon discharge from the facility. (G8907) Patient did not have preoperative order for IV antibiotic SSI prophylaxis. (G8918)  

## 2012-06-15 NOTE — Patient Instructions (Addendum)

## 2012-06-15 NOTE — Op Note (Signed)
[facility] [address] [place], [place]  [address]  ENDOSCOPY PROCEDURE REPORT  [name]:  Trevor, [name]  MR#:  [id number] BIRTHDATE:  11/16/1946, 66 yrs. old  GENDER:  male  ENDOSCOPIST:  Wilhemina Bonito. Eda Keys, MD Referred by:  Office  PROCEDURE DATE:  06/15/2012 PROCEDURE:  EGD, diagnostic 43235 ASA CLASS:  Class II INDICATIONS:  weight loss, GERD  MEDICATIONS:   MAC sedation, administered by CRNA, propofol (Diprivan) 120 mg IV TOPICAL ANESTHETIC:  none  DESCRIPTION OF PROCEDURE:   After Trevor risks benefits and alternatives of Trevor procedure were thoroughly explained, informed consent was obtained.  Trevor LB GIF-H180 [id number] endoscope was introduced through Trevor mouth and advanced to Trevor second portion of Trevor duodenum, without limitations.  Trevor instrument was slowly withdrawn as Trevor mucosa was fully examined. 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 Otherwise Trevor examination was normal.    Retroflexed views revealed a hiatal hernia.    Trevor scope was then withdrawn from Trevor [name] and Trevor procedure completed.  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 SEEMS TO BE RELATED TO DECREASED PO INTAKE. STABLE RECENTLY  RECOMMENDATIONS: 1) Anti-reflux regimen to be followed 2) Return to Trevor care of Dr Trevor [name]  ______________________________ Wilhemina Bonito. Eda Keys, MD  CC:  Trevor Magnus, MD;Trevor [name]  n. Rosalie DoctorWilhemina Bonito. Eda Keys at 06/15/2012 05:09 PM  [place]opes, [phone]

## 2012-06-15 NOTE — Op Note (Signed)
[facility] [address] [place], [place]  [address]  COLONOSCOPY PROCEDURE REPORT  PATIENT:  Trevor [name], Trevor [name]  MR#:  [id number] BIRTHDATE:  [birth year], 66 yrs. old  GENDER:  male ENDOSCOPIST:  Wilhemina Bonito. Eda Keys, MD REF. BY:  Office PROCEDURE DATE:  06/15/2012 PROCEDURE:  Colonoscopy with snare polypectomy x 3 ASA CLASS:  Class II INDICATIONS:  Screening, weight loss 12-2002 diverticulosis (SML) MEDICATIONS:   MAC sedation, administered by CRNA, propofol (Diprivan) 400 mg IV  DESCRIPTION OF PROCEDURE:   After the risks benefits and alternatives of the procedure were thoroughly explained, informed consent was obtained.  Digital rectal exam was performed and revealed no abnormalities.   The LB CF-[id number] endoscope was introduced through the anus and advanced to the cecum, which was identified by both the appendix and ileocecal valve, without limitations.  The quality of the prep was adequate, using MoviPrep.  The instrument was then slowly withdrawn as the colon was fully examined. <<PROCEDUREIMAGES>>  FINDINGS:  A diminutive polyp was found in the cecum and snared without cautery.  A 14mm sessile and 5mm sessile polyp was found in the ascending colon. Polyps were snared, then cauterized with monopolar cautery. Retrieval was successful.   Severe diverticulosis was found throughout the colon.   Retroflexed views in the rectum revealed internal hemorrhoids.    The time to cecum = 3:07   minutes. The scope was then withdrawn in  19:29  minutes from the cecum and the procedure completed.  COMPLICATIONS:  None  ENDOSCOPIC IMPRESSION: 1) Diminutive polyp in the cecum - removed 2) Sessile polyps in the ascending colon - removed 3) Severe diverticulosis throughout the colon 4) Internal hemorrhoids  RECOMMENDATIONS: 1) Repeat Colonoscopy in 3 years. 2) Upper endoscopy today  ______________________________ Wilhemina Bonito. Eda Keys, MD  CC:  Lindley Magnus, MD;  The  Patient  n. eSIGNED:   Wilhemina Bonito. Eda Keys at 06/15/2012 04:57 PM  [place]opes, [phone]

## 2012-06-18 ENCOUNTER — Telehealth: Payer: Self-pay | Admitting: *Deleted

## 2012-06-18 NOTE — Telephone Encounter (Signed)
  Follow up Call-  Call back number 06/15/2012  Post procedure Call Back phone  # [phone]  Permission to leave phone message Yes     Patient questions:  Do you have a fever, pain , or abdominal swelling? no Pain Score  0 *  Have you tolerated food without any problems? yes  Have you been able to return to your normal activities? yes  Do you have any questions about your discharge instructions: Diet   no Medications  no Follow up visit  no  Do you have questions or concerns about your Care? no  Actions: * If pain score is 4 or above: No action needed, pain <4.

## 2012-06-22 ENCOUNTER — Encounter: Payer: Self-pay | Admitting: Internal Medicine

## 2012-09-24 IMAGING — CT CT ABD-PELV W/ CM
2 of 5 series · 16 of 46 positions shown, 18 images · IV contrast (Omnipaque 300)
Comparison: None.

CLINICAL DATA: Weight loss and early satiety

CT ABDOMEN AND PELVIS WITH CONTRAST
TECHNIQUE: Multidetector CT imaging of the abdomen and pelvis was
performed following the standard protocol during bolus
administration of intravenous contrast.
Contrast: 100mL OMNIPAQUE IOHEXOL 300 MG/ML  SOLN

[Series 2: abd/ pel 5mm · axial · 0.71mm/px · z∈[-494,-84]mm · 13 of 92 slices shown, 15 images]
[im 5/92  soft-tissue]
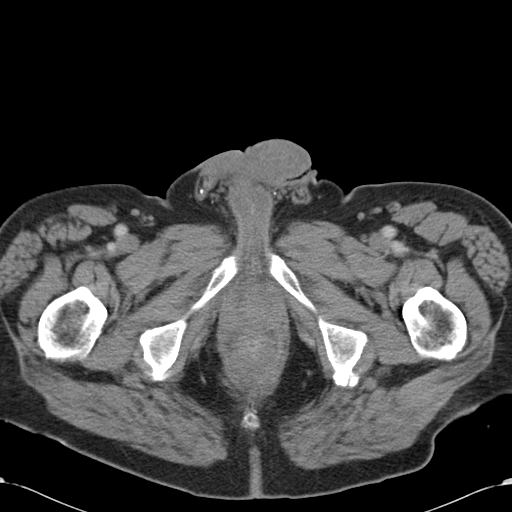
[im 5/92  bone]
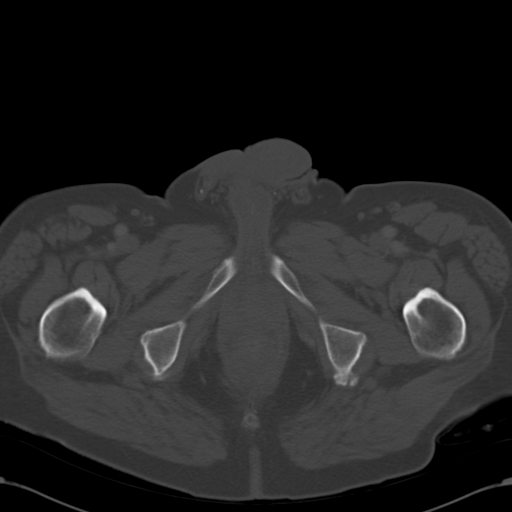
[im 15/92  soft-tissue]
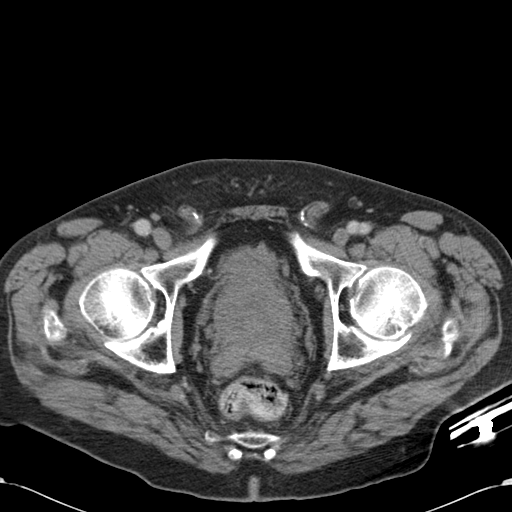
[im 20/92  soft-tissue]
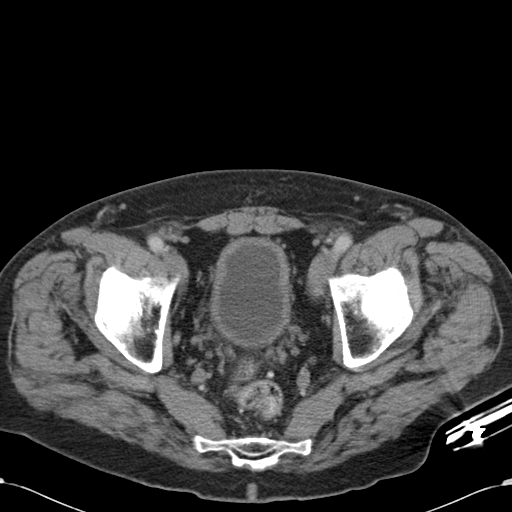
[im 24/92  soft-tissue]
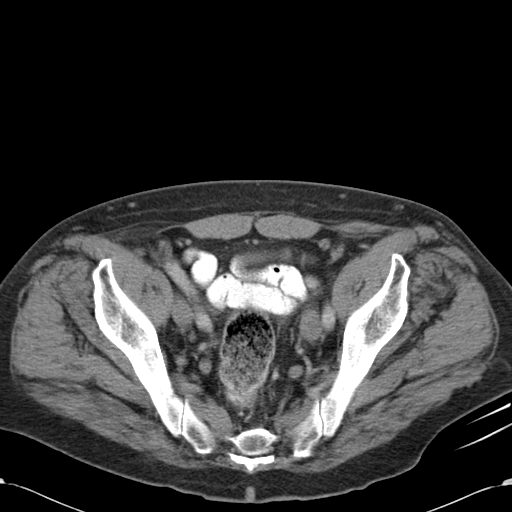
[im 34/92  soft-tissue]
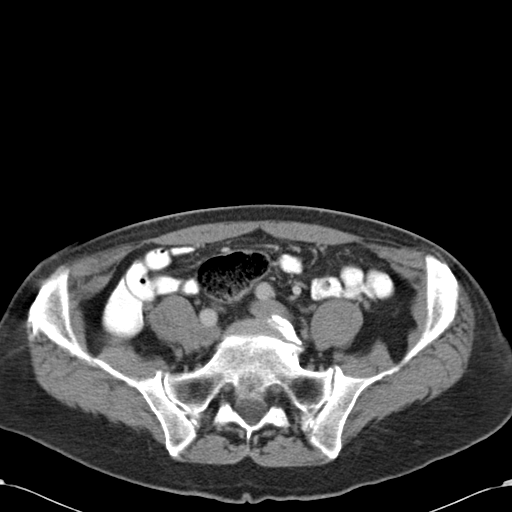
[im 39/92  soft-tissue]
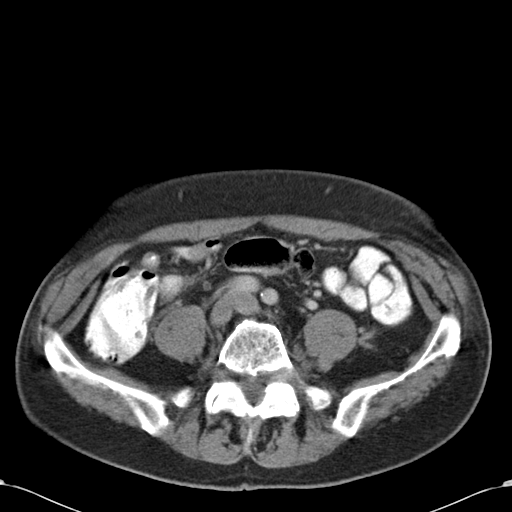
[im 48/92  soft-tissue]
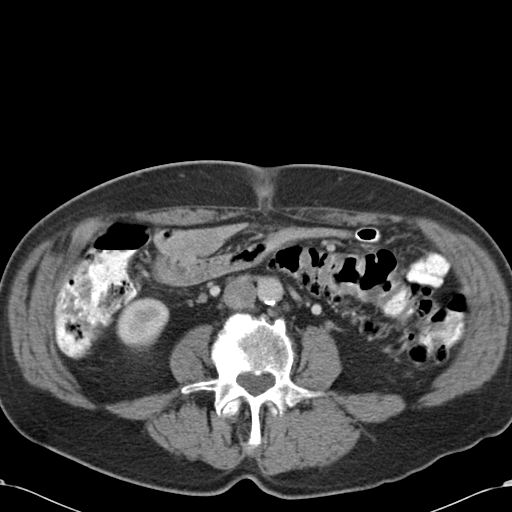
[im 53/92  soft-tissue]
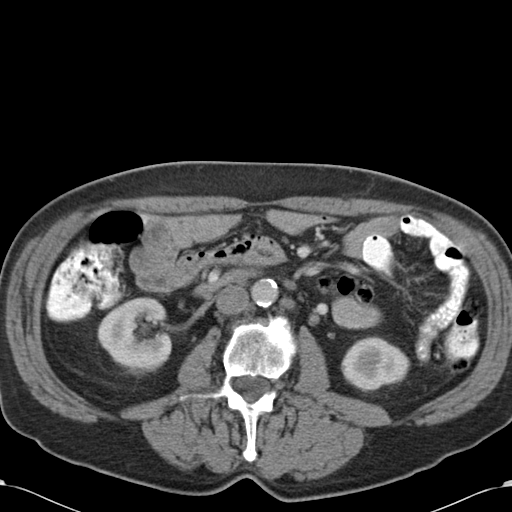
[im 58/92  soft-tissue]
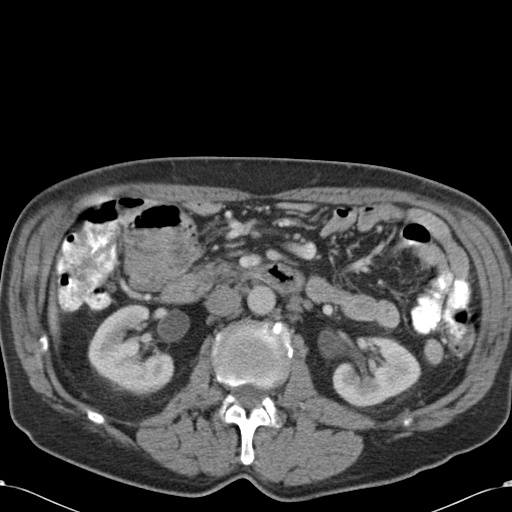
[im 58/92  bone]
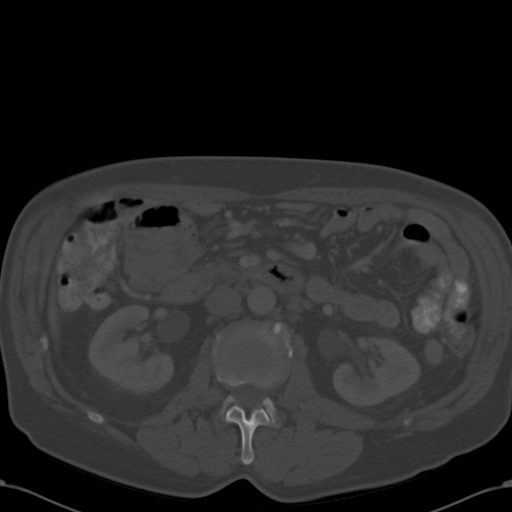
[im 68/92  soft-tissue]
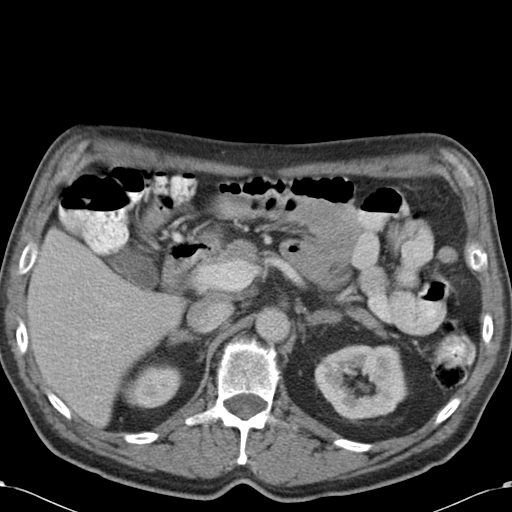
[im 72/92  soft-tissue]
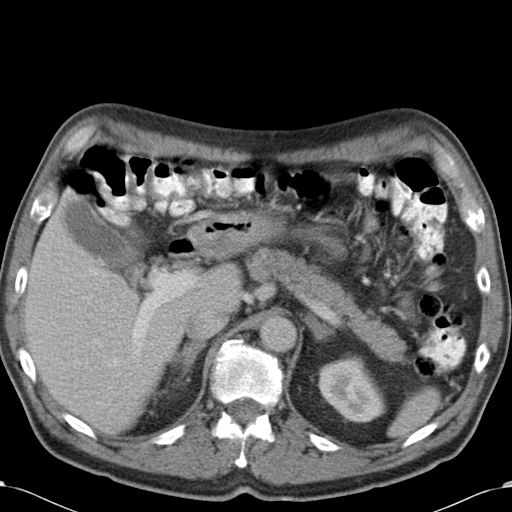
[im 77/92  soft-tissue]
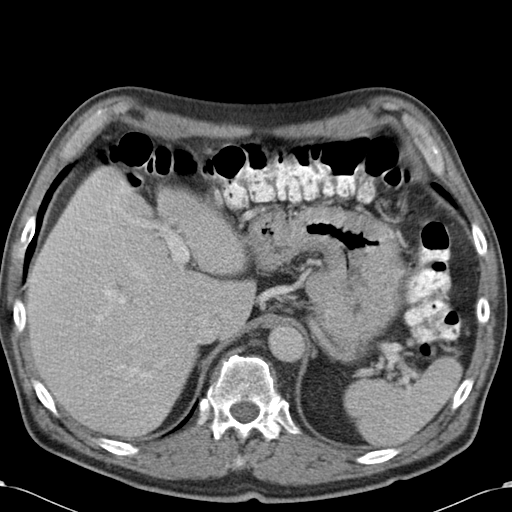
[im 87/92  soft-tissue]
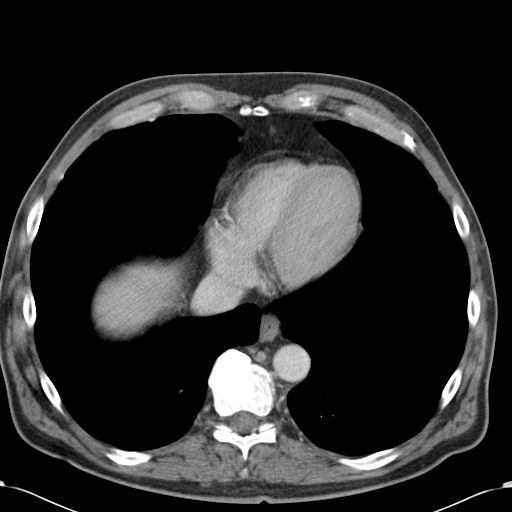

[Series 602: cor · coronal · 0.93mm/px · 3 of 114 slices shown]
[im 38/114  soft-tissue]
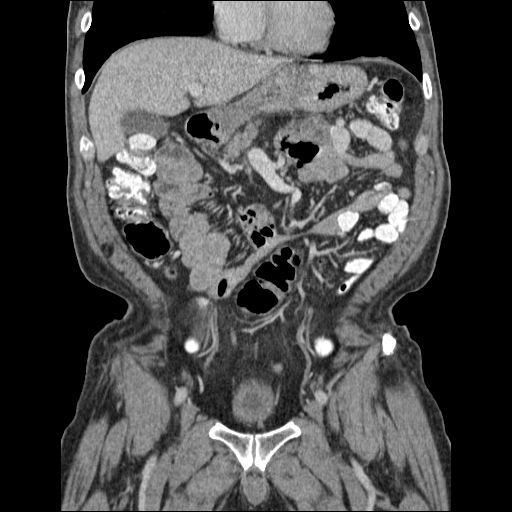
[im 51/114  soft-tissue]
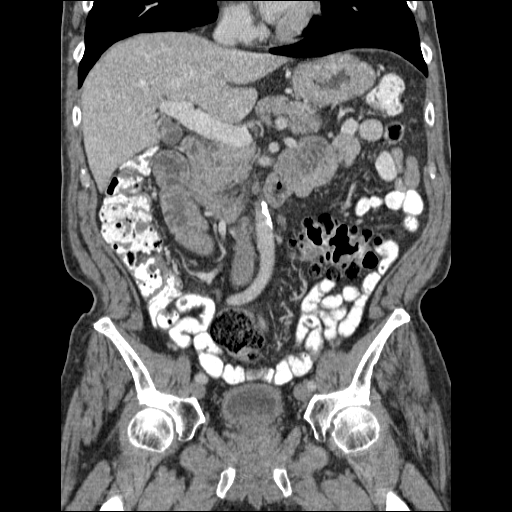
[im 63/114  soft-tissue]
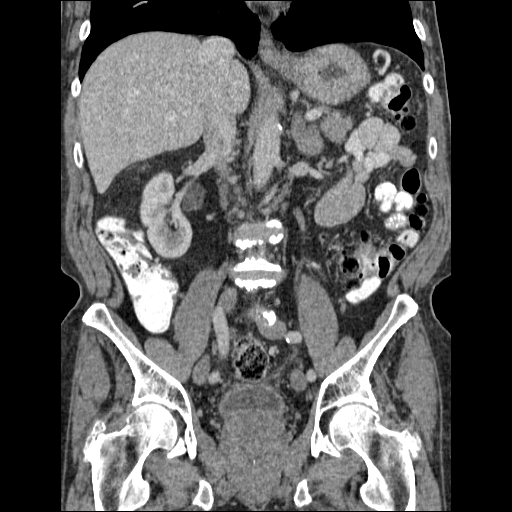

[16 of 46 positions shown; findings below may reference images not displayed]

FINDINGS: Subpleural nodule within the right middle lobe measures 5
mm.  Also in the right middle lobe is a 5.7 mm nodule, image #5.

There is no focal liver abnormality.  The gallbladder appears
within normal limits.  The pancreas appears within normal limits.
No biliary dilatation.  The spleen is normal.

Adreniform enlargement of both adrenal glands noted.  No discrete
mass.  The right kidney appears normal.  Three stones are
identified within the lower pole collecting system of the left
kidney which measure up to 5.1 mm, image 31.  No hydronephrosis.
Urinary bladder appears normal.  The prostate gland is enlarged and
has mass effect upon the bladder base.  Symmetric appearance of the
seminal vesicles.

There is a borderline enlarged retroperitoneal lymph node.  Within
the periaortic region the lymph node measures 10 mm, image 38.  No
enlarged pelvic or inguinal lymph nodes.

No free fluid or fluid collections within the abdomen or pelvis.
Normal appearance of the proximal colon.  Multiple sigmoid
diverticula identified without acute inflammation.  No peritoneal
nodule or mass identified within the abdomen or the pelvis.

There is multilevel level disc space narrowing and ventral
spurring.  Most severe at the L4-5 level.
IMPRESSION: 1.  No acute findings or specific features to suggest metastatic
disease peri
2.  Nonspecific pulmonary nodules in the right middle lobe measure
up to 5.7 mm If the patient is at high risk for bronchogenic
carcinoma, follow-up chest CT at 6-12 months is recommended.  If
the patient is at low risk for bronchogenic carcinoma, follow-up
chest CT at 12 months is recommended.  This recommendation follows
the consensus statement: Guidelines for Management of Small
Pulmonary Nodules Detected on CT Scans: A Statement from the

3.  Borderline enlarged periaortic lymph node.
4.  Nonobstructing left renal calculi.
5.  Prostate gland enlargement.

## 2013-02-25 ENCOUNTER — Telehealth: Payer: Self-pay | Admitting: Internal Medicine

## 2013-02-25 MED ORDER — LISINOPRIL-HYDROCHLOROTHIAZIDE 20-25 MG PO TABS: 1.0000 | ORAL_TABLET | Freq: Every day | ORAL | Status: DC

## 2013-02-25 NOTE — Telephone Encounter (Signed)
Patient Information:  Caller Name: Graciella Belton  Phone: [phone]  Patient: Trevor [name], Trevor [name]  Gender: Male  DOB: 05/27/1946  Age: 67 Years  PCP: Birdie Sons (Adults only)  Office Follow Up:  Does the office need to follow up with this patient?: Yes  Instructions For The Office: [facility] on [address] is requested.  Asks for 90 day supply of Lisinopril/HCTZ - previous Rx information in notes.  RN Note:  Medication Detail      Disp Refills Start End      lisinopril-hydrochlorothiazide (PRINZIDE,ZESTORETIC) 20-25 MG per tablet 90 tablet 3 05/03/2012        Sig - Route: Take 1 tablet by mouth daily. - Oral  Symptoms  Reason For Call & Symptoms: Spouse/ Dianne states insurance has changed and they would like to get Rx for Lisinopril called to [facility].  Reviewed Health History In EMR: Yes  Reviewed Medications In EMR: Yes  Reviewed Allergies In EMR: Yes  Reviewed Surgeries / Procedures: Yes  Date of Onset of Symptoms: Unknown  Guideline(s) Used:  No Protocol Available - Information Only  Disposition Per Guideline:   Discuss with PCP and Callback by Nurse Today  Reason For Disposition Reached:   Nursing judgment  Advice Given:  Call Back If:  New symptoms develop

## 2013-02-25 NOTE — Telephone Encounter (Signed)
rx sent in electronically for a 90 day supply, but pt will need OV after that

## 2013-05-19 ENCOUNTER — Other Ambulatory Visit: Payer: Self-pay | Admitting: Internal Medicine

## 2013-05-20 MED ORDER — LISINOPRIL-HYDROCHLOROTHIAZIDE 20-25 MG PO TABS: 1.0000 | ORAL_TABLET | Freq: Every day | ORAL | Status: DC

## 2013-05-20 NOTE — Addendum Note (Signed)
Addended by: Alfred Levins D on: 05/20/2013 10:10 AM   Modules accepted: Orders

## 2013-08-20 ENCOUNTER — Other Ambulatory Visit (INDEPENDENT_AMBULATORY_CARE_PROVIDER_SITE_OTHER): Payer: Medicare Other

## 2013-08-20 DIAGNOSIS — I10 Essential (primary) hypertension: Secondary | ICD-10-CM

## 2013-08-20 DIAGNOSIS — Z0000 Encounter for general adult medical examination without abnormal findings: Secondary | ICD-10-CM

## 2013-08-20 LAB — BASIC METABOLIC PANEL
BUN: 35 mg/dL — ABNORMAL HIGH (ref 6–23)
Chloride: 102 mEq/L (ref 96–112)
GFR: 48.21 mL/min — ABNORMAL LOW (ref >60.00)
Glucose, Bld: 126 mg/dL — ABNORMAL HIGH (ref 70–99)
Potassium: 4.5 mEq/L (ref 3.5–5.1)
Sodium: 135 mEq/L (ref 135–145)

## 2013-08-20 LAB — CBC WITH DIFFERENTIAL/PLATELET
Eosinophils Absolute: 0.4 10*3/uL (ref 0.0–0.7)
Eosinophils Relative: 4 % (ref 0.0–5.0)
HCT: 41.7 % (ref 39.0–52.0)
Hemoglobin: 14.4 g/dL (ref 13.0–17.0)
Lymphs Abs: 2.4 10*3/uL (ref 0.7–4.0)
MCV: 90.9 fl (ref 78.0–100.0)
Monocytes Relative: 7.3 % (ref 3.0–12.0)
Neutro Abs: 6.9 10*3/uL (ref 1.4–7.7)
RDW: 12.1 % (ref 11.5–14.6)

## 2013-08-20 LAB — POCT URINALYSIS DIPSTICK
Bilirubin, UA: NEGATIVE
Ketones, UA: NEGATIVE
Nitrite, UA: NEGATIVE
Spec Grav, UA: 1.025
pH, UA: 5

## 2013-08-20 LAB — LIPID PANEL
Cholesterol: 181 mg/dL (ref 0–200)
HDL: 47.8 mg/dL (ref >39.00)
Total CHOL/HDL Ratio: 4
Triglycerides: 139 mg/dL (ref 0.0–149.0)

## 2013-08-20 LAB — HEPATIC FUNCTION PANEL
ALT: 21 U/L (ref 0–53)
AST: 16 U/L (ref 0–37)
Albumin: 4.1 g/dL (ref 3.5–5.2)
Total Protein: 7.1 g/dL (ref 6.0–8.3)

## 2013-08-26 ENCOUNTER — Encounter: Payer: Managed Care, Other (non HMO) | Admitting: Internal Medicine

## 2013-08-31 ENCOUNTER — Other Ambulatory Visit: Payer: Self-pay | Admitting: Internal Medicine

## 2013-09-09 ENCOUNTER — Ambulatory Visit (INDEPENDENT_AMBULATORY_CARE_PROVIDER_SITE_OTHER): Payer: Medicare Other | Admitting: Internal Medicine

## 2013-09-09 ENCOUNTER — Encounter: Payer: Self-pay | Admitting: Internal Medicine

## 2013-09-09 VITALS — BP 125/84 | HR 88 | Temp 98.3°F | Ht 71.0 in | Wt 200.0 lb

## 2013-09-09 DIAGNOSIS — Z87442 Personal history of urinary calculi: Secondary | ICD-10-CM

## 2013-09-09 DIAGNOSIS — Z23 Encounter for immunization: Secondary | ICD-10-CM

## 2013-09-09 DIAGNOSIS — I10 Essential (primary) hypertension: Secondary | ICD-10-CM

## 2013-09-09 DIAGNOSIS — N529 Male erectile dysfunction, unspecified: Secondary | ICD-10-CM

## 2013-09-09 DIAGNOSIS — Z0000 Encounter for general adult medical examination without abnormal findings: Secondary | ICD-10-CM

## 2013-09-09 DIAGNOSIS — E8881 Metabolic syndrome: Secondary | ICD-10-CM

## 2013-09-09 DIAGNOSIS — F17200 Nicotine dependence, unspecified, uncomplicated: Secondary | ICD-10-CM

## 2013-09-09 DIAGNOSIS — E785 Hyperlipidemia, unspecified: Secondary | ICD-10-CM | POA: Insufficient documentation

## 2013-09-09 MED ORDER — SILDENAFIL CITRATE 50 MG PO TABS: 50.0000 mg | ORAL_TABLET | Freq: Every day | ORAL | Status: DC | PRN

## 2013-09-09 NOTE — Progress Notes (Signed)
cpx  Note he feels occasional lightheadedness when he stands quickly. Says home bps on a wrist cuff are "good".  He walks regularly  He still smokes  Past Medical History  Diagnosis Date  . Diverticulosis of colon   . Hypertension   . Cataract   . Chronic kidney disease     kidney stone    History   Social History  . Marital Status: Married    Spouse Name: N/A    Number of Children: 5  . Years of Education: N/A   Occupational History  . retired    Social History Main Topics  . Smoking status: Current Every Day Smoker -- 0.50 packs/day for 45 years    Types: Cigarettes  . Smokeless tobacco: Never Used     Comment: Tobacco info given 05/03/12  . Alcohol Use: Yes     Comment: rarely  . Drug Use: No  . Sexual Activity: Not on file   Other Topics Concern  . Not on file   Social History Narrative  . No narrative on file    Past Surgical History  Procedure Laterality Date  . Cystoscopy    . Cataract extraction w/ intraocular lens implant  04/2012  . Tonsillectomy  1953    Family History  Problem Relation Age of Onset  . Alcohol abuse Father   . Heart disease Father   . Hyperlipidemia Mother   . Osteoporosis Mother   . Colon cancer Neg Hx     No Known Allergies  Current Outpatient Prescriptions on File Prior to Visit  Medication Sig Dispense Refill  . aspirin 81 MG tablet Take 81 mg by mouth daily.        Marland Kitchen lisinopril-hydrochlorothiazide (PRINZIDE,ZESTORETIC) 20-25 MG per tablet take 1 tablet by mouth daily  90 tablet  0  . Multiple Vitamin (MULTIVITAMIN) tablet Take 1 tablet by mouth daily.        . saw palmetto 160 MG capsule Take 160 mg by mouth 2 (two) times daily.         No current facility-administered medications on file prior to visit.     patient denies chest pain, shortness of breath, orthopnea. Denies lower extremity edema, abdominal pain, change in appetite, change in bowel movements. Patient denies rashes, musculoskeletal complaints. No  other specific complaints in a complete review of systems.   Exam: reviewed vitals  well-developed well-nourished male in no acute distress. HEENT exam atraumatic, normocephalic, neck supple without jugular venous distention. Chest clear to auscultation cardiac exam S1-S2 are regular. Abdominal exam overweight with bowel sounds, soft and nontender. Extremities no edema. Neurologic exam is alert with a normal gait. Prostate- moderately enlarged

## 2013-09-09 NOTE — Assessment & Plan Note (Signed)
Note glucose- Discussed need for weight loss- Goal - bmi< 25

## 2013-09-10 ENCOUNTER — Other Ambulatory Visit: Payer: Self-pay | Admitting: Internal Medicine

## 2013-09-10 DIAGNOSIS — E785 Hyperlipidemia, unspecified: Secondary | ICD-10-CM

## 2013-09-10 DIAGNOSIS — I10 Essential (primary) hypertension: Secondary | ICD-10-CM

## 2013-09-10 DIAGNOSIS — E8881 Metabolic syndrome: Secondary | ICD-10-CM

## 2013-09-10 NOTE — Assessment & Plan Note (Signed)
Adequate control He does describe some orthostasis. Discussed at length. He will monitor bp at home. If bp consistently below 120/75 we may consider decreasing meds

## 2013-09-10 NOTE — Assessment & Plan Note (Signed)
Check labs 

## 2013-09-10 NOTE — Assessment & Plan Note (Signed)
He understands the need to quit, he is not interested at this time

## 2013-09-10 NOTE — Assessment & Plan Note (Signed)
New problem Discussed Likely caused by peripheral vascular disease. Recommended stop smoking Samples of viagra given

## 2013-09-11 ENCOUNTER — Encounter: Payer: Managed Care, Other (non HMO) | Admitting: Internal Medicine

## 2013-12-03 ENCOUNTER — Other Ambulatory Visit: Payer: Self-pay | Admitting: Internal Medicine

## 2014-03-03 ENCOUNTER — Other Ambulatory Visit: Payer: Self-pay | Admitting: Internal Medicine

## 2014-03-10 ENCOUNTER — Other Ambulatory Visit (INDEPENDENT_AMBULATORY_CARE_PROVIDER_SITE_OTHER): Payer: Commercial Managed Care - HMO

## 2014-03-10 DIAGNOSIS — E8881 Metabolic syndrome: Secondary | ICD-10-CM

## 2014-03-10 DIAGNOSIS — E785 Hyperlipidemia, unspecified: Secondary | ICD-10-CM

## 2014-03-10 DIAGNOSIS — I10 Essential (primary) hypertension: Secondary | ICD-10-CM

## 2014-03-10 LAB — BASIC METABOLIC PANEL
BUN: 13 mg/dL (ref 6–23)
CALCIUM: 9.7 mg/dL (ref 8.4–10.5)
CO2: 27 meq/L (ref 19–32)
Chloride: 102 mEq/L (ref 96–112)
Creatinine, Ser: 1 mg/dL (ref 0.4–1.5)
GFR: 80.14 mL/min (ref >60.00)
GLUCOSE: 106 mg/dL — AB (ref 70–99)
Potassium: 3.4 mEq/L — ABNORMAL LOW (ref 3.5–5.1)
SODIUM: 138 meq/L (ref 135–145)

## 2014-03-10 LAB — LIPID PANEL
CHOL/HDL RATIO: 3
Cholesterol: 171 mg/dL (ref 0–200)
HDL: 66.3 mg/dL (ref >39.00)
LDL Cholesterol: 79 mg/dL (ref 0–99)
Triglycerides: 130 mg/dL (ref 0.0–149.0)
VLDL: 26 mg/dL (ref 0.0–40.0)

## 2014-03-10 LAB — HEPATIC FUNCTION PANEL
ALT: 15 U/L (ref 0–53)
AST: 15 U/L (ref 0–37)
Albumin: 3.9 g/dL (ref 3.5–5.2)
Alkaline Phosphatase: 67 U/L (ref 39–117)
BILIRUBIN DIRECT: 0.1 mg/dL (ref 0.0–0.3)
BILIRUBIN TOTAL: 1.1 mg/dL (ref 0.3–1.2)
Total Protein: 6.8 g/dL (ref 6.0–8.3)

## 2014-03-10 LAB — MICROALBUMIN / CREATININE URINE RATIO
CREATININE, U: 373.5 mg/dL
Microalb Creat Ratio: 0.5 mg/g (ref 0.0–30.0)
Microalb, Ur: 1.7 mg/dL (ref 0.0–1.9)

## 2014-03-10 LAB — HEMOGLOBIN A1C: Hgb A1c MFr Bld: 5.5 % (ref 4.6–6.5)

## 2014-06-02 ENCOUNTER — Other Ambulatory Visit: Payer: Self-pay | Admitting: Internal Medicine

## 2014-06-04 ENCOUNTER — Other Ambulatory Visit: Payer: Self-pay | Admitting: Internal Medicine

## 2014-09-15 ENCOUNTER — Telehealth: Payer: Self-pay | Admitting: Family Medicine

## 2014-09-15 NOTE — Telephone Encounter (Signed)
ERROR

## 2014-11-25 ENCOUNTER — Other Ambulatory Visit: Payer: Self-pay | Admitting: Internal Medicine

## 2014-12-03 ENCOUNTER — Ambulatory Visit (INDEPENDENT_AMBULATORY_CARE_PROVIDER_SITE_OTHER): Payer: Commercial Managed Care - HMO | Admitting: Family Medicine

## 2014-12-03 ENCOUNTER — Encounter: Payer: Self-pay | Admitting: Family Medicine

## 2014-12-03 VITALS — BP 104/92 | HR 93 | Temp 98.3°F | Wt 216.0 lb

## 2014-12-03 DIAGNOSIS — E785 Hyperlipidemia, unspecified: Secondary | ICD-10-CM

## 2014-12-03 DIAGNOSIS — I10 Essential (primary) hypertension: Secondary | ICD-10-CM

## 2014-12-03 DIAGNOSIS — F17200 Nicotine dependence, unspecified, uncomplicated: Secondary | ICD-10-CM

## 2014-12-03 DIAGNOSIS — N400 Enlarged prostate without lower urinary tract symptoms: Secondary | ICD-10-CM | POA: Insufficient documentation

## 2014-12-03 DIAGNOSIS — R739 Hyperglycemia, unspecified: Secondary | ICD-10-CM

## 2014-12-03 DIAGNOSIS — N401 Enlarged prostate with lower urinary tract symptoms: Secondary | ICD-10-CM

## 2014-12-03 DIAGNOSIS — J309 Allergic rhinitis, unspecified: Secondary | ICD-10-CM | POA: Insufficient documentation

## 2014-12-03 DIAGNOSIS — F1011 Alcohol abuse, in remission: Secondary | ICD-10-CM | POA: Insufficient documentation

## 2014-12-03 DIAGNOSIS — R351 Nocturia: Secondary | ICD-10-CM

## 2014-12-03 DIAGNOSIS — Z720 Tobacco use: Secondary | ICD-10-CM

## 2014-12-03 DIAGNOSIS — J302 Other seasonal allergic rhinitis: Secondary | ICD-10-CM

## 2014-12-03 NOTE — Assessment & Plan Note (Signed)
Patient is using nasocort prn and instructed to use scheduled in AM, allegra at night. Patient to contact me in 6 weeks, if poor control would add singulair to above regimen.

## 2014-12-03 NOTE — Assessment & Plan Note (Addendum)
We discussed smoking cessation for approximately 7 minutes. 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 I reiterated extensively importance to his long term health. Also discussed CT low dose screening and gave some information to read at home from [facility]

## 2014-12-03 NOTE — Assessment & Plan Note (Signed)
Continue Lisinopril-hctz 20-25mg . Mild poor diastolic control when previously controlled, we discussed if DBP remains elevated at next visit, may need to increase lisinopril portion to 40mg . ALternatively, possibly dry cough related to lisinopril and could consider D/c

## 2014-12-03 NOTE — Patient Instructions (Addendum)
Allergies- stick with allegra at night and 2 sprays nasocort in each nostril in AM. Give this a 6 week trial. I will call in singulair if this does not control your symptoms.   Read over the information on lung cancer screening. We will discuss at your physical.   Schedule a physical for my next available.   Can refill all medications for a year based off this visit.   Offered prevnar today or can get at physical  Quit smoking

## 2014-12-03 NOTE — Progress Notes (Signed)
Trevor Reddish, MD Phone: [phone]  Subjective:  Patient presents today to establish care with me as their new primary care provider. Patient was formerly a patient of Dr. Leanne Chang. Chief complaint-noted.   Allergic rhinitis -claritin and zyrtec did not help. Allegra relieves symptoms somewhat and has been taking along with benadryl.   For last 1.5 years complains of Sneezing, watery itchy eyes. Rhinitis. Occasional dry cough ? Lisinopril related ROS- no fever/chills/sinus pressure.   Tobacco abuse 1/2 PPD down from PPD. Not interested in quitting currently.  ROS- denies chest pain or shortness of breath  Hypertension-well controlled SBP, poor control DBP  BP Readings from Last 3 Encounters:  12/03/14 104/92  09/09/13 125/84  06/15/12 130/93  Home BP monitoring-no Compliant with medications-yes without side effects ROS-Denies any CP, HA, SOB, blurry vision, LE edema.  Nocturia at least once a night  The following were reviewed and entered/updated in epic: Past Medical History  Diagnosis Date  . Diverticulosis of colon   . Hypertension   . Cataract   . Chronic kidney disease     kidney stone   Patient Active Problem List   Diagnosis Date Noted  . TOBACCO USE 12/25/2009    Priority: High  . BPH (benign prostatic hyperplasia) 12/03/2014    Priority: Medium  . History of alcohol abuse 12/03/2014    Priority: Medium  . Hyperlipidemia 09/09/2013    Priority: Medium  . Essential hypertension 07/31/2007    Priority: Medium  . Allergic rhinitis 12/03/2014    Priority: Low  . Erectile dysfunction 09/09/2013    Priority: Low  . Metabolic syndrome X 70/62/3762    Priority: Low  . NEPHROLITHIASIS, HX OF 07/31/2007    Priority: Low   Past Surgical History  Procedure Laterality Date  . Cystoscopy      kidney stones  . Cataract extraction w/ intraocular lens implant  04/2012    bilateral  . Tonsillectomy  1953    Family History  Problem Relation Age of Onset  .  Alcohol abuse Father   . Heart disease Father     37 MI  . Hyperlipidemia Mother   . Osteoporosis Mother   . Colon cancer Neg Hx     Medications- reviewed and updated Current Outpatient Prescriptions  Medication Sig Dispense Refill  . aspirin 81 MG tablet Take 81 mg by mouth daily.      Marland Kitchen lisinopril-hydrochlorothiazide (PRINZIDE,ZESTORETIC) 20-25 MG per tablet take 1 tablet by mouth daily 90 tablet 0  . Multiple Vitamin (MULTIVITAMIN) tablet Take 1 tablet by mouth daily.      . saw palmetto 160 MG capsule Take 160 mg by mouth 2 (two) times daily.       No current facility-administered medications for this visit.    Allergies-reviewed and updated No Known Allergies  History   Social History  . Marital Status: Married    Spouse Name: N/A    Number of Children: 29  . Years of Education: N/A   Occupational History  . retired    Social History Main Topics  . Smoking status: Current Every Day Smoker -- 0.50 packs/day for 45 years    Types: Cigarettes  . Smokeless tobacco: Never Used     Comment: Tobacco info given 05/03/12  . Alcohol Use: No     Comment: abstinent 20 years, AA  . Drug Use: No  . Sexual Activity: None   Other Topics Concern  . None   Social History Narrative   Married 1982 2nd  marriage- 3 stepkids. First 1973-2 children. 7 grandchildren total.    Empty nest lives with wife.       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    Security at [employer].       Hobbies: former golfer-sparing now, time at [employer], travel    ROS--See HPI   Objective: BP 104/92 mmHg  Pulse 93  Temp(Src) 98.3 F (36.8 C)  Wt 216 lb (97.977 kg) Gen: NAD, resting comfortably, smells of smoke HEENT: Mucous membranes are moist. Oropharynx normal CV: RRR no murmurs rubs or gallops Lungs: CTAB no crackles, wheeze, rhonchi Abdomen: soft/nontender/nondistended/normal bowel sounds. Abdominal obesity noted. Ext: no  edema Skin: warm, dry, no rash Neuro: grossly normal, moves all extremities, PERRLA   Assessment/Plan:  Allergic rhinitis Patient is using nasocort prn and instructed to use scheduled in AM, allegra at night. Patient to contact me in 6 weeks, if poor control would add singulair to above regimen.   TOBACCO USE We discussed smoking cessation for approximately 7 minutes. 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 I reiterated extensively importance to his long term health. Also discussed CT low dose screening and gave some information to read at home from [employer]  Essential hypertension Continue Lisinopril-hctz 20-25mg . Mild poor diastolic control when previously controlled, we discussed if DBP remains elevated at next visit, may need to increase lisinopril portion to 40mg . ALternatively, possibly dry cough related to lisinopril and could consider D/c   Return precautions advised. To follow up for annual physical at next available.   Future fasting labs Orders Placed This Encounter  Procedures  . CBC    [place]    Standing Status: Future     Number of Occurrences:      Standing Expiration Date: 12/04/2015  . Comprehensive metabolic panel    [place]    Standing Status: Future     Number of Occurrences:      Standing Expiration Date: 12/04/2015    Order Specific Question:  Has the patient fasted?    Answer:  No  . Hemoglobin A1c    [place]    Standing Status: Future     Number of Occurrences:      Standing Expiration Date: 12/04/2015  . Lipid panel    [place]    Standing Status: Future     Number of Occurrences:      Standing Expiration Date: 12/04/2015    Order Specific Question:  Has the patient fasted?    Answer:  No  . PSA, [facility]    Standing Status: Future     Number of Occurrences:       Standing Expiration Date: 12/04/2015  hyperglycemia in past with a1c of 6 so repeat especially with weight up >10 lbs since last visit

## 2015-02-22 ENCOUNTER — Other Ambulatory Visit: Payer: Self-pay | Admitting: Internal Medicine

## 2015-04-22 ENCOUNTER — Other Ambulatory Visit: Payer: Commercial Managed Care - HMO

## 2015-04-23 ENCOUNTER — Other Ambulatory Visit (INDEPENDENT_AMBULATORY_CARE_PROVIDER_SITE_OTHER): Payer: Commercial Managed Care - HMO

## 2015-04-23 DIAGNOSIS — I10 Essential (primary) hypertension: Secondary | ICD-10-CM

## 2015-04-23 DIAGNOSIS — R351 Nocturia: Secondary | ICD-10-CM | POA: Diagnosis not present

## 2015-04-23 DIAGNOSIS — R739 Hyperglycemia, unspecified: Secondary | ICD-10-CM | POA: Diagnosis not present

## 2015-04-23 DIAGNOSIS — E785 Hyperlipidemia, unspecified: Secondary | ICD-10-CM

## 2015-04-23 DIAGNOSIS — N401 Enlarged prostate with lower urinary tract symptoms: Secondary | ICD-10-CM

## 2015-04-23 LAB — COMPREHENSIVE METABOLIC PANEL
ALT: 14 U/L (ref 0–53)
AST: 14 U/L (ref 0–37)
Albumin: 3.6 g/dL (ref 3.5–5.2)
Alkaline Phosphatase: 72 U/L (ref 39–117)
BILIRUBIN TOTAL: 0.6 mg/dL (ref 0.2–1.2)
BUN: 15 mg/dL (ref 6–23)
CO2: 32 meq/L (ref 19–32)
Calcium: 9.7 mg/dL (ref 8.4–10.5)
Chloride: 101 mEq/L (ref 96–112)
Creatinine, Ser: 1 mg/dL (ref 0.40–1.50)
GFR: 78.95 mL/min (ref >60.00)
GLUCOSE: 108 mg/dL — AB (ref 70–99)
Potassium: 4 mEq/L (ref 3.5–5.1)
SODIUM: 137 meq/L (ref 135–145)
Total Protein: 6.4 g/dL (ref 6.0–8.3)

## 2015-04-23 LAB — LIPID PANEL
Cholesterol: 167 mg/dL (ref 0–200)
HDL: 63.6 mg/dL (ref >39.00)
LDL CALC: 85 mg/dL (ref 0–99)
NonHDL: 103.4
Total CHOL/HDL Ratio: 3
Triglycerides: 93 mg/dL (ref 0.0–149.0)
VLDL: 18.6 mg/dL (ref 0.0–40.0)

## 2015-04-23 LAB — CBC
HEMATOCRIT: 44.5 % (ref 39.0–52.0)
Hemoglobin: 15.5 g/dL (ref 13.0–17.0)
MCHC: 34.8 g/dL (ref 30.0–36.0)
MCV: 92 fl (ref 78.0–100.0)
Platelets: 253 10*3/uL (ref 150.0–400.0)
RBC: 4.84 Mil/uL (ref 4.22–5.81)
RDW: 14 % (ref 11.5–15.5)
WBC: 8.5 10*3/uL (ref 4.0–10.5)

## 2015-04-23 LAB — HEMOGLOBIN A1C: Hgb A1c MFr Bld: 5.8 % (ref 4.6–6.5)

## 2015-04-23 LAB — PSA, MEDICARE: PSA: 2.82 ng/mL (ref 0.10–4.00)

## 2015-04-29 ENCOUNTER — Encounter: Payer: Self-pay | Admitting: Family Medicine

## 2015-04-29 ENCOUNTER — Ambulatory Visit (INDEPENDENT_AMBULATORY_CARE_PROVIDER_SITE_OTHER): Payer: Commercial Managed Care - HMO | Admitting: Family Medicine

## 2015-04-29 VITALS — BP 104/66 | HR 100 | Temp 98.1°F | Wt 218.0 lb

## 2015-04-29 DIAGNOSIS — R310 Gross hematuria: Secondary | ICD-10-CM

## 2015-04-29 DIAGNOSIS — L570 Actinic keratosis: Secondary | ICD-10-CM | POA: Diagnosis not present

## 2015-04-29 DIAGNOSIS — Z23 Encounter for immunization: Secondary | ICD-10-CM | POA: Diagnosis not present

## 2015-04-29 DIAGNOSIS — R739 Hyperglycemia, unspecified: Secondary | ICD-10-CM | POA: Insufficient documentation

## 2015-04-29 DIAGNOSIS — Z0000 Encounter for general adult medical examination without abnormal findings: Secondary | ICD-10-CM | POA: Diagnosis not present

## 2015-04-29 LAB — URINALYSIS, MICROSCOPIC ONLY

## 2015-04-29 NOTE — Progress Notes (Signed)
Trevor Reddish, MD Phone: [phone]  Subjective:  Patient presents today for their annual physical. Chief complaint-noted.   1/2 PPD. Discussed 1800quit now and cold [place] may be more affective than cutting back-patient declines for now.   Allergies resonably controlled with nasocort in AM, allegra PM?  Weight keeps going up. Did just recently realize through insurance could use [employer] membership. Swimming 3-4 days a week for 1 week now  A few weeks ago noted Cloudy red in urine 1x without recurrence. Urine micro today without RBC.   Knows Weight and smoking are biggest issues. Informed of hyperglycemia  Remains alcohol free and going to AA  ROS- full  review of systems was completed and negative specifically no chest pain, shortness of breath. Known large prostate and pees about 2x a night, daytime stream stronger on saw palmetto.   The following were reviewed and entered/updated in epic: Past Medical History  Diagnosis Date  . Diverticulosis of colon   . Hypertension   . Cataract   . Chronic kidney disease     kidney stone   Patient Active Problem List   Diagnosis Date Noted  . TOBACCO USE 12/25/2009    Priority: High  . BPH (benign prostatic hyperplasia) 12/03/2014    Priority: Medium  . History of alcohol abuse 12/03/2014    Priority: Medium  . Hyperlipidemia 09/09/2013    Priority: Medium  . Essential hypertension 07/31/2007    Priority: Medium  . Actinic keratosis 04/29/2015    Priority: Low  . Allergic rhinitis 12/03/2014    Priority: Low  . Erectile dysfunction 09/09/2013    Priority: Low  . Metabolic syndrome X 33/54/5625    Priority: Low  . NEPHROLITHIASIS, HX OF 07/31/2007    Priority: Low   Past Surgical History  Procedure Laterality Date  . Cystoscopy      kidney stones  . Cataract extraction w/ intraocular lens implant  04/2012    bilateral  . Tonsillectomy  1953    Family History  Problem Relation Age of Onset  . Alcohol abuse Father     . Heart disease Father     59 MI  . Hyperlipidemia Mother   . Osteoporosis Mother   . Colon cancer Neg Hx     Medications- reviewed and updated Current Outpatient Prescriptions  Medication Sig Dispense Refill  . aspirin 81 MG tablet Take 81 mg by mouth daily.      Marland Kitchen lisinopril-hydrochlorothiazide (PRINZIDE,ZESTORETIC) 20-25 MG per tablet take 1 tablet by mouth once daily 90 tablet 0  . Multiple Vitamin (MULTIVITAMIN) tablet Take 1 tablet by mouth daily.      . saw palmetto 160 MG capsule Take 160 mg by mouth 2 (two) times daily.       No current facility-administered medications for this visit.    Allergies-reviewed and updated No Known Allergies  History   Social History  . Marital Status: Married    Spouse Name: N/A  . Number of Children: 5  . Years of Education: N/A   Occupational History  . retired    Social History Main Topics  . Smoking status: Current Every Day Smoker -- 0.50 packs/day for 45 years    Types: Cigarettes  . Smokeless tobacco: Never Used     Comment: Tobacco info given 05/03/12  . Alcohol Use: No     Comment: abstinent 20 years, AA  . Drug Use: No  . Sexual Activity: Not on file   Other Topics Concern  . None  Social History Narrative   Married 1982 2nd marriage- 3 stepkids. First 1973-2 children. 7 grandchildren total.    Empty nest lives with wife.       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    Security at [employer].       Hobbies: former golfer-sparing now, time at [employer], travel    ROS--See HPI   Objective: BP 104/66 mmHg  Pulse 100  Temp(Src) 98.1 F (36.7 C)  Wt 218 lb (98.884 kg) Gen: NAD, resting comfortably HEENT: Mucous membranes are moist. Oropharynx normal. TM normal.  Neck: no thyromegaly CV: RRR (right at 100 HR) no murmurs rubs or gallops Lungs: CTAB no crackles, wheeze, rhonchi Abdomen: soft/nontender/nondistended/normal bowel sounds. No  rebound or guarding.  Ext: no edema Rectal: the prostate is on normal consistency without nodules. Diffusely enlarged Skin: warm, dry, 2 scaly lesions on top of scalp with erythematous base consistent with AK, rest of body without obvious AK, BCC, SCC, melanoma. Several nevi and SKs.  Neuro: grossly normal, moves all extremities, PERRLA   Assessment/Plan:  69 y.o. male presenting for annual physical.  Health Maintenance counseling: 1. Anticipatory guidance: Patient counseled regarding regular dental exams, eye exams, wearing seatbelts, wearing sunscreen. Does not see dermatology.  2. Risk factor reduction:  Advised patient of need for regular exercise and diet rich and fruits (skipping lunch and advised against) and vegetables to reduce risk of heart attack and stroke.  3. Immunizations/screenings/ancillary studies- up to date with prevnar 13 today 4. Encouraged to quit smoking. Discussed 1800quit now and cold [place] may be more affective than cutting back-patient declines for now.    Big focus- weight goal <200 in 6 months- cut sugar sweetened bev, eat lunch, continue newly started swimming. . Quit smoking. Return if recurrent hematuria or refer directly to urology.   Actinic keratosis 2 AK on top of head. Completed cryotherapy today.    6 months.   Orders Placed This Encounter  Procedures  . Pneumococcal conjugate vaccine 13-valent   Results for orders placed or performed in visit on 04/29/15 (from the past 24 hour(s))  Urine Microscopic Only     Status: Abnormal   Collection Time: 04/29/15 12:19 PM  Result Value Ref Range   WBC, UA 0-2/hpf 0-2/hpf   RBC / HPF 0-2/hpf 0-2/hpf   Mucus, UA Presence of (A) None   Squamous Epithelial / LPF Rare(0-4/hpf) Rare(0-4/hpf)

## 2015-04-29 NOTE — Patient Instructions (Addendum)
Received final pneumonia shot today ([id number]).  Froze 2 precancers on top of head. May blister up. Can use vaseline to cover areas  Strongly advise you to quit smoking. COld [place] is probably your best bet. If you become ready, I can send in some patches for you though may not be covered.   Weight trending up. At risk for diabetes now. Recommend weight uner 200 and see tips below for help.   Check a urine today. If there is blood on urine or if you see blood again, will refer to urology.   Great job with the swimming      My 5 to Fitness!  5: fruits and vegetables per day (work on 9 per day if you are at 5) 4: exercise 4-5 times per week for at least 30 minutes (walking counts!) 3: meals per day (don't skip breakfast!) 2: habits to quit -smoking -excess alcohol use (men >2 beer/day; women >1beer/day) 1: sweet per day (2 cookies, 1 small cup of ice cream, 12 oz soda)  These are general tips for healthy living. Try to start with 1 or 2 habit TODAY and make it a part of your life for several months.   Once you have 1 or 2 habits down for several months, try to begin working on your next healthy habit. With every single step you take, you will be leading a healthier lifestyle!

## 2015-04-29 NOTE — Assessment & Plan Note (Signed)
2 AK on top of head. Completed cryotherapy today.

## 2015-05-21 ENCOUNTER — Encounter: Payer: Self-pay | Admitting: Internal Medicine

## 2015-05-22 ENCOUNTER — Other Ambulatory Visit: Payer: Self-pay | Admitting: *Deleted

## 2015-05-22 MED ORDER — LISINOPRIL-HYDROCHLOROTHIAZIDE 20-25 MG PO TABS: 1.0000 | ORAL_TABLET | Freq: Every day | ORAL | Status: DC

## 2015-08-14 ENCOUNTER — Other Ambulatory Visit: Payer: Self-pay | Admitting: Family Medicine

## 2015-10-21 DIAGNOSIS — H52209 Unspecified astigmatism, unspecified eye: Secondary | ICD-10-CM | POA: Diagnosis not present

## 2015-10-21 DIAGNOSIS — H5210 Myopia, unspecified eye: Secondary | ICD-10-CM | POA: Diagnosis not present

## 2015-10-21 DIAGNOSIS — I10 Essential (primary) hypertension: Secondary | ICD-10-CM | POA: Diagnosis not present

## 2015-11-14 ENCOUNTER — Other Ambulatory Visit: Payer: Self-pay | Admitting: Family Medicine

## 2016-06-28 ENCOUNTER — Other Ambulatory Visit (INDEPENDENT_AMBULATORY_CARE_PROVIDER_SITE_OTHER): Payer: Commercial Managed Care - HMO

## 2016-06-28 DIAGNOSIS — Z125 Encounter for screening for malignant neoplasm of prostate: Secondary | ICD-10-CM

## 2016-06-28 DIAGNOSIS — Z0000 Encounter for general adult medical examination without abnormal findings: Secondary | ICD-10-CM

## 2016-06-28 LAB — POC URINALSYSI DIPSTICK (AUTOMATED)
Glucose, UA: NEGATIVE
Ketones, UA: NEGATIVE
LEUKOCYTES UA: NEGATIVE
NITRITE UA: NEGATIVE
PH UA: 6.5
Spec Grav, UA: 1.025
UROBILINOGEN UA: 2

## 2016-06-28 LAB — LIPID PANEL
CHOLESTEROL: 175 mg/dL (ref 0–200)
HDL: 74 mg/dL (ref >39.00)
LDL Cholesterol: 88 mg/dL (ref 0–99)
NonHDL: 101.44
TRIGLYCERIDES: 68 mg/dL (ref 0.0–149.0)
Total CHOL/HDL Ratio: 2
VLDL: 13.6 mg/dL (ref 0.0–40.0)

## 2016-06-28 LAB — CBC WITH DIFFERENTIAL/PLATELET
BASOS PCT: 0.7 % (ref 0.0–3.0)
Basophils Absolute: 0.1 10*3/uL (ref 0.0–0.1)
EOS ABS: 0.3 10*3/uL (ref 0.0–0.7)
EOS PCT: 3.7 % (ref 0.0–5.0)
HEMATOCRIT: 46 % (ref 39.0–52.0)
HEMOGLOBIN: 15.5 g/dL (ref 13.0–17.0)
Lymphocytes Relative: 21.8 % (ref 12.0–46.0)
Lymphs Abs: 1.9 10*3/uL (ref 0.7–4.0)
MCHC: 33.7 g/dL (ref 30.0–36.0)
MCV: 96 fl (ref 78.0–100.0)
MONO ABS: 0.8 10*3/uL (ref 0.1–1.0)
Monocytes Relative: 9.7 % (ref 3.0–12.0)
NEUTROS ABS: 5.5 10*3/uL (ref 1.4–7.7)
Neutrophils Relative %: 64.1 % (ref 43.0–77.0)
PLATELETS: 229 10*3/uL (ref 150.0–400.0)
RBC: 4.79 Mil/uL (ref 4.22–5.81)
RDW: 13.8 % (ref 11.5–15.5)
WBC: 8.6 10*3/uL (ref 4.0–10.5)

## 2016-06-28 LAB — HEPATIC FUNCTION PANEL
ALT: 12 U/L (ref 0–53)
AST: 14 U/L (ref 0–37)
Albumin: 3.8 g/dL (ref 3.5–5.2)
Alkaline Phosphatase: 65 U/L (ref 39–117)
Bilirubin, Direct: 0.1 mg/dL (ref 0.0–0.3)
TOTAL PROTEIN: 6.3 g/dL (ref 6.0–8.3)
Total Bilirubin: 0.7 mg/dL (ref 0.2–1.2)

## 2016-06-28 LAB — BASIC METABOLIC PANEL
BUN: 18 mg/dL (ref 6–23)
CO2: 36 meq/L — AB (ref 19–32)
CREATININE: 1 mg/dL (ref 0.40–1.50)
Calcium: 9.6 mg/dL (ref 8.4–10.5)
Chloride: 98 mEq/L (ref 96–112)
GFR: 78.67 mL/min (ref >60.00)
GLUCOSE: 119 mg/dL — AB (ref 70–99)
POTASSIUM: 4.1 meq/L (ref 3.5–5.1)
Sodium: 138 mEq/L (ref 135–145)

## 2016-06-28 LAB — PSA: PSA: 3.03 ng/mL (ref 0.10–4.00)

## 2016-06-28 LAB — TSH: TSH: 0.83 u[IU]/mL (ref 0.35–4.50)

## 2016-06-30 ENCOUNTER — Other Ambulatory Visit: Payer: Commercial Managed Care - HMO

## 2016-07-08 ENCOUNTER — Encounter: Payer: Commercial Managed Care - HMO | Admitting: Family Medicine

## 2016-07-13 ENCOUNTER — Other Ambulatory Visit (INDEPENDENT_AMBULATORY_CARE_PROVIDER_SITE_OTHER): Payer: Commercial Managed Care - HMO

## 2016-07-13 ENCOUNTER — Ambulatory Visit (INDEPENDENT_AMBULATORY_CARE_PROVIDER_SITE_OTHER): Payer: Commercial Managed Care - HMO | Admitting: Family Medicine

## 2016-07-13 ENCOUNTER — Encounter: Payer: Self-pay | Admitting: Family Medicine

## 2016-07-13 VITALS — BP 122/72 | HR 88 | Temp 97.8°F | Ht 70.25 in | Wt 226.8 lb

## 2016-07-13 DIAGNOSIS — R319 Hematuria, unspecified: Secondary | ICD-10-CM | POA: Diagnosis not present

## 2016-07-13 DIAGNOSIS — R6889 Other general symptoms and signs: Secondary | ICD-10-CM

## 2016-07-13 DIAGNOSIS — Z0001 Encounter for general adult medical examination with abnormal findings: Secondary | ICD-10-CM | POA: Diagnosis not present

## 2016-07-13 DIAGNOSIS — F17200 Nicotine dependence, unspecified, uncomplicated: Secondary | ICD-10-CM

## 2016-07-13 DIAGNOSIS — R739 Hyperglycemia, unspecified: Secondary | ICD-10-CM

## 2016-07-13 LAB — URINALYSIS, MICROSCOPIC ONLY
RBC / HPF: NONE SEEN (ref 0–?)
WBC, UA: NONE SEEN (ref 0–?)

## 2016-07-13 LAB — POCT GLYCOSYLATED HEMOGLOBIN (HGB A1C): Hemoglobin A1C: 5.7

## 2016-07-13 NOTE — Assessment & Plan Note (Signed)
Tobacco abuse- advised cessation. Down to 7 a day from 10-12. Working to further cut back.   Lung cancer screening program- referral today given over 30 pack years smoking  Hematuria on initial UA- urine microscopic- urine microscopic today, urology if positive

## 2016-07-13 NOTE — Progress Notes (Signed)
Pre visit review using our clinic review tool, if applicable. No additional management support is needed unless otherwise documented below in the visit note. 

## 2016-07-13 NOTE — Progress Notes (Signed)
Phone: [phone]  Subjective:  Patient presents today for their annual physical. Chief complaint-noted.   See problem oriented charting- ROS- full  review of systems was completed and negative including No chest pain or shortness of breath. No headache or blurry vision.   The following were reviewed and entered/updated in epic: Past Medical History:  Diagnosis Date  . Cataract   . Chronic kidney disease    kidney stone  . Diverticulosis of colon   . Hypertension    Patient Active Problem List   Diagnosis Date Noted  . TOBACCO USE 12/25/2009    Priority: High  . Hyperglycemia 04/29/2015    Priority: Medium  . BPH (benign prostatic hyperplasia) 12/03/2014    Priority: Medium  . History of alcohol abuse 12/03/2014    Priority: Medium  . Hyperlipidemia 09/09/2013    Priority: Medium  . Essential hypertension 07/31/2007    Priority: Medium  . Actinic keratosis 04/29/2015    Priority: Low  . Allergic rhinitis 12/03/2014    Priority: Low  . Erectile dysfunction 09/09/2013    Priority: Low  . Metabolic syndrome X [id number]    Priority: Low  . NEPHROLITHIASIS, HX OF 07/31/2007    Priority: Low   Past Surgical History:  Procedure Laterality Date  . CATARACT EXTRACTION W/ INTRAOCULAR LENS IMPLANT  04/2012   bilateral  . CYSTOSCOPY     kidney stones  . TONSILLECTOMY  1953    Family History  Problem Relation Age of Onset  . Alcohol abuse Father   . Heart disease Father     72 MI  . Hyperlipidemia Mother   . Osteoporosis Mother   . Colon cancer Neg Hx     Medications- reviewed and updated Current Outpatient Prescriptions  Medication Sig Dispense Refill  . aspirin 81 MG tablet Take 81 mg by mouth daily.      Marland Kitchen lisinopril-hydrochlorothiazide (PRINZIDE,ZESTORETIC) 20-25 MG tablet take 1 tablet by mouth once daily 90 tablet 3  . Multiple Vitamin (MULTIVITAMIN) tablet Take 1 tablet by mouth daily.      . saw palmetto 160 MG capsule Take 160 mg by mouth 2 (two)  times daily.       No current facility-administered medications for this visit.     Allergies-reviewed and updated No Known Allergies  Social History   Social History  . Marital status: Married    Spouse name: N/A  . Number of children: 5  . Years of education: N/A   Occupational History  . retired    Social History Main Topics  . Smoking status: Current Every Day Smoker    Packs/day: 0.75    Years: 45.00    Types: Cigarettes  . Smokeless tobacco: Never Used     Comment: Tobacco info given 05/03/12  . Alcohol use No     Comment: abstinent 20 years, AA  . Drug use: No  . Sexual activity: Not Asked   Other Topics Concern  . None   Social History Narrative   Married 1982 2nd marriage- 3 stepkids. First 1973-2 children. 7 grandchildren total.    Empty nest lives with wife.       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    Security at [employer].       Hobbies: former golfer-sparing now, time at [employer], travel    Objective: BP 122/72   Pulse 88   Temp 97.8 F (36.6 C) (Oral)  Ht 5' 10.25" (1.784 m)   Wt 226 lb 12.8 oz (102.9 kg)   SpO2 93%   BMI 32.31 kg/m  Gen: NAD, resting comfortably HEENT: Mucous membranes are moist. Oropharynx normal Neck: no thyromegaly CV: RRR no murmurs rubs or gallops Lungs: CTAB no crackles, wheeze, rhonchi Abdomen: soft/nontender/nondistended/normal bowel sounds. No rebound or guarding.  Rectal: normal tone, diffusely enlarged prostate, no masses or tenderness Ext: no edema Skin: warm, dry Neuro: grossly normal, moves all extremities, PERRLA  Assessment/Plan:  70 y.o. male presenting for annual physical.  Health Maintenance counseling: 1. Anticipatory guidance: Patient counseled regarding regular dental exams, eye exams, wearing seatbelts.  2. Risk factor reduction:  Advised patient of need for regular exercise and diet rich and fruits and vegetables to reduce  risk of heart attack and stroke. 2-3x a week bike and treadmill at [facility] 3. Immunizations/screenings/ancillary studies Immunization History  Administered Date(s) Administered  . Influenza,inj,Quad PF,36+ Mos 09/09/2013, 10/02/2014  . Influenza-Unspecified 10/28/2015  . Pneumococcal Conjugate-13 04/29/2015  . Pneumococcal Polysaccharide-23 04/18/2012  . Td 11/05/2008  . Zoster 04/12/2011   Health Maintenance Due  Topic Date Due  . Hepatitis C Screening - next labs 07[birth year]20[birth year]47  . COLONOSCOPY - 2013 with 3 year follow up advised, discussed today, gave prior letter- he will call 06/16/2015   4. Prostate cancer screening- low risk based off PSA trend, BPH on exam. Symptoms stable on saw palmetto . Nocturia twice a night stable, sometimes 3  Lab Results  Component Value Date   PSA 3.03 06/28/2016   PSA 2.82 04/23/2015   PSA 3.88 08/20/2013   5. Colon cancer screening - see above  Status of chronic or acute concerns   HLD- at goal with weight loss with LDL 88   Hyperglycemia- remains at risk for diabetes, a1c POC 5.7 also at risk. Discussed need for weight loss  HTN- poor control initially on lisinopril HCT 20-25mg , improved on repeat.  BP Readings from Last 3 Encounters:  07/13/16 122/72  04/29/15 104/66  12/03/14 (!) 104/92   TOBACCO USE Tobacco abuse- advised cessation. Down to 7 a day from 10-12. Working to further cut back.   Lung cancer screening program- referral today given over 30 pack years smoking  Hematuria on initial UA- urine microscopic- urine microscopic today, urology if positive  6 montsh  Orders Placed This Encounter  Procedures  . Ambulatory Referral for Lung Cancer Scre    Referral Priority:   Routine    Referral Type:   Consultation    Referral Reason:   Specialty Services Required    Number of Visits Requested:   1   Return precautions advised.   Garret Reddish, MD

## 2016-07-13 NOTE — Patient Instructions (Addendum)
We will call you within a week about your referral to lung cancer screening program. If you do not hear within 2 weeks, give [place] a call.   Repeat urine test today.   Increased risk for diabetes. Need to increase walking, improve diet including eating breakfast and lunch. Weight loss needed Wt Readings from Last 3 Encounters:  07/13/16 226 lb 12.8 oz (102.9 kg)  04/29/15 218 lb (98.9 kg)  12/03/14 216 lb (98 kg)    Follow up 6 months

## 2016-07-21 ENCOUNTER — Telehealth: Payer: Self-pay | Admitting: Acute Care

## 2016-07-21 DIAGNOSIS — F17210 Nicotine dependence, cigarettes, uncomplicated: Secondary | ICD-10-CM

## 2016-07-21 NOTE — Telephone Encounter (Signed)
Called spoke with pt.  Scheduled SDMV 08/29/16 at 12pm CT order placed Pt voiced understanding and had no further questions Nothing further needed.

## 2016-08-29 ENCOUNTER — Encounter: Payer: Self-pay | Admitting: Acute Care

## 2016-08-29 ENCOUNTER — Ambulatory Visit (INDEPENDENT_AMBULATORY_CARE_PROVIDER_SITE_OTHER): Payer: Commercial Managed Care - HMO | Admitting: Acute Care

## 2016-08-29 ENCOUNTER — Ambulatory Visit (INDEPENDENT_AMBULATORY_CARE_PROVIDER_SITE_OTHER): Payer: Commercial Managed Care - HMO

## 2016-08-29 DIAGNOSIS — I700 Atherosclerosis of aorta: Secondary | ICD-10-CM

## 2016-08-29 DIAGNOSIS — Z122 Encounter for screening for malignant neoplasm of respiratory organs: Secondary | ICD-10-CM

## 2016-08-29 DIAGNOSIS — F17210 Nicotine dependence, cigarettes, uncomplicated: Secondary | ICD-10-CM | POA: Diagnosis not present

## 2016-08-29 DIAGNOSIS — I2510 Atherosclerotic heart disease of native coronary artery without angina pectoris: Secondary | ICD-10-CM | POA: Diagnosis not present

## 2016-08-29 NOTE — Progress Notes (Signed)
Shared Decision Making Visit Lung Cancer Screening Program [phone])   Eligibility:  Age 70 y.o.  Pack Years Smoking History Calculation 47 pack year history (# packs/per year x # years smoked)  Recent History of coughing up blood  no  Unexplained weight loss? no ( >Than 15 pounds within the last 6 months )  Prior History Lung / other cancer no (Diagnosis within the last 5 years already requiring surveillance chest CT Scans).  Smoking Status Current Smoker  Former Smokers: Years since quit: NA  Quit Date: NA  Visit Components:  Discussion included one or more decision making aids. yes  Discussion included risk/benefits of screening. yes  Discussion included potential follow up diagnostic testing for abnormal scans. yes  Discussion included meaning and risk of over diagnosis. yes  Discussion included meaning and risk of False Positives. yes  Discussion included meaning of total radiation exposure. yes  Counseling Included:  Importance of adherence to annual lung cancer LDCT screening. yes  Impact of comorbidities on ability to participate in the program. yes  Ability and willingness to under diagnostic treatment. yes  Smoking Cessation Counseling:  Current Smokers:   Discussed importance of smoking cessation. yes  Information about tobacco cessation classes and interventions provided to patient. yes  Patient provided with "ticket" for LDCT Scan. yes  Symptomatic Patient. no  Counseling  Diagnosis Code: Tobacco Use Z72.0  Asymptomatic Patient yes  Counseling (Intermediate counseling: > three minutes counseling) [id number]  Former Smokers:   Discussed the importance of maintaining cigarette abstinence. yes  Diagnosis Code: Personal History of Nicotine Dependence. [id number]  Information about tobacco cessation classes and interventions provided to patient. Yes  Patient provided with "ticket" for LDCT Scan. yes  Written Order for Lung Cancer Screening with  LDCT placed in Epic. Yes (CT Chest Lung Cancer Screening Low Dose W/O CM) [id number] Z12.2-Screening of respiratory organs Z87.891-Personal history of nicotine dependence   I have spent 20 minutes of face to face time with Mr. And Mrs Wengel discussing the risks and benefits of lung cancer screening. We viewed a power point together that explained in detail the above noted topics. We paused at intervals to allow for questions to be asked and answered to ensure understanding.We discussed that the single most powerful action that he can take to decrease his risk of developing lung cancer is to quit smoking. We discussed whether or not he is ready to commit to setting a quit date. He is currently not ready to set a quit date, although he is contemplating it. We discussed options for tools to aid in quitting smoking including nicotine replacement therapy, non-nicotine medications, support groups, Quit Smart classes, and behavior modification. We discussed that often times setting smaller, more achievable goals, such as eliminating 1 cigarette a day for a week and then 2 cigarettes a day for a week can be helpful in slowly decreasing the number of cigarettes smoked. This allows for a sense of accomplishment as well as providing a clinical benefit. I gave him the " Be Stronger Than Your Excuses" card with contact information for community resources, classes, free nicotine replacement therapy, and access to mobile apps, text messaging, and on-line smoking cessation help. I have also given him my card and contact information in the event he needs to contact me. We discussed the time and location of the scan, and that either June Leap, CMA, or I will call with the results within 24-48 hours of receiving them. I have provided him with a copy  of the power point we viewed  as a resource in the event they need reinforcement of the concepts we discussed today in the office. The patient verbalized understanding of all of  the  above and had no further questions upon leaving the office. They have my contact information in the event they have any further questions.  Magdalen Spatz, NP  08/29/2016

## 2016-08-30 ENCOUNTER — Other Ambulatory Visit: Payer: Self-pay | Admitting: Acute Care

## 2016-08-30 ENCOUNTER — Telehealth: Payer: Self-pay | Admitting: Acute Care

## 2016-08-30 DIAGNOSIS — F17210 Nicotine dependence, cigarettes, uncomplicated: Principal | ICD-10-CM

## 2016-08-30 NOTE — Telephone Encounter (Signed)
Routed to Judson Roch for follow up as pt is Lung Cancer Screening

## 2016-08-30 NOTE — Telephone Encounter (Signed)
Patient called back regarding results - pr

## 2016-08-30 NOTE — Telephone Encounter (Signed)
I attempted to call Trevor [name] with his low dose CT results. There was no answer. I left a message requesting he call the office for results with contact information. I will await his return call.

## 2016-08-31 NOTE — Telephone Encounter (Signed)
I have called Trevor [name] with the results of his CT scan. I explained that his scan was read as a.  Lung RADS 2: nodules that are benign in appearance and behavior with a very low likelihood of becoming a clinically active cancer due to size or lack of growth. Recommendation per radiology is for a repeat LDCT in 12 months.I told him we will call and schedule his follow up scan in Sept. 2018. I also explained that the scan indicated aortic atherosclerosis in addition to left main and 3 vessel CAD. I explained in a non- gated exam like this, the presence of CAD can be noted, but the degree or severity cannot. His cholesterol is checked on a regular basis by his PCP, Dr. Yong Channel, and per the patient it is always good. I will forward these results to Dr. Yong Channel to follow up with clinically as he feels is indicated as he knows this patient's health history well.We also discussed that the scan indicated emphysema and suspected underlying COPD. We discussed that the patient may benefit from a pulmonary consult. I will also pass this along to Dr. Yong Channel. Trevor [name] verbalized understanding of the above and had no further questions upon completion of the call.He has my contact information in the event he has any further questions.

## 2016-09-07 ENCOUNTER — Telehealth: Payer: Self-pay | Admitting: Acute Care

## 2016-09-07 NOTE — Telephone Encounter (Signed)
[id number] x1 

## 2016-09-08 NOTE — Telephone Encounter (Signed)
I have sent a message to Dr. Yong Channel explaining that the patient was complaining of exertional dyspnea while in my office for his shared decision making visit. I explained in my message that per the CT results there was mild central lobar and paraseptal emphysema noted in addition to imaging findings suggestive of underlying COPD. I explained that we would be happy to see the patient on referral if he felt that was clinically indicated. I did indicate that the patient would perhaps benefit from pulmonary function tests and inhaler therapy for his exertional dyspnea. We will defer to  Dr. Yong Channel regarding whether  he would prefer to manage these symptoms himself, as primary PCP., or if he would prefer ambulatory referral to pulmonary. I also indicated that we'll be happy to assist him in any way that he feels may positively impact the patient's care.

## 2016-09-08 NOTE — Telephone Encounter (Signed)
Spoke with pt's wife, states that Dr. Yong Channel will not place referral to pulmonary until a note is sent from SG to Dr. Yong Channel stating why pt needs a referral to pulmonary.  Pt has [employer] and will need a referral from pcp in order to be seen in this clinic.  SG please advise.  Thanks.

## 2016-09-08 NOTE — Telephone Encounter (Signed)
Pt's wife Beverlee Nims is returning call. Request call back at [phone]

## 2016-09-13 ENCOUNTER — Other Ambulatory Visit: Payer: Self-pay

## 2016-09-13 DIAGNOSIS — J438 Other emphysema: Secondary | ICD-10-CM

## 2016-09-13 DIAGNOSIS — R0609 Other forms of dyspnea: Secondary | ICD-10-CM

## 2016-10-04 ENCOUNTER — Ambulatory Visit (INDEPENDENT_AMBULATORY_CARE_PROVIDER_SITE_OTHER): Payer: Commercial Managed Care - HMO | Admitting: Pulmonary Disease

## 2016-10-04 ENCOUNTER — Encounter: Payer: Self-pay | Admitting: Pulmonary Disease

## 2016-10-04 VITALS — BP 104/66 | HR 85 | Ht 71.0 in | Wt 227.4 lb

## 2016-10-04 DIAGNOSIS — J449 Chronic obstructive pulmonary disease, unspecified: Secondary | ICD-10-CM | POA: Diagnosis not present

## 2016-10-04 MED ORDER — GLYCOPYRROLATE-FORMOTEROL 9-4.8 MCG/ACT IN AERO: 2.0000 | INHALATION_SPRAY | Freq: Two times a day (BID) | RESPIRATORY_TRACT | 0 refills | Status: AC

## 2016-10-04 NOTE — Patient Instructions (Signed)
We will start you on bevespi inhaler Schedule for PFTs Return in 1 month

## 2016-10-04 NOTE — Progress Notes (Signed)
Patient seen in the office today and instructed on use of Bevespi.  Patient expressed understanding and demonstrated technique. Parke Poisson, CMA 10/04/16

## 2016-10-04 NOTE — Progress Notes (Signed)
Trevor [name]    [id number]    02-27-1946  Primary Care Physician:Trevor Yong Channel, MD  Referring Physician: Marin Olp, MD [place], [place] [address]  Chief complaint:   Consult for evaluation of COPD.  HPI: Trevor [name] is a 70 year old with active smoking. He's complains of dyspnea on exertion for the past several years. He has chronic daily cough with white sputum production. Has never had pulmonary function tests or been on inhalers before. He had a recent screening CT of the chest and a chest x-ray which showed COPD and pulmonary nodules.  Outpatient Encounter Prescriptions as of 10/04/2016  Medication Sig  . aspirin 81 MG tablet Take 81 mg by mouth daily.    Marland Kitchen lisinopril-hydrochlorothiazide (PRINZIDE,ZESTORETIC) 20-25 MG tablet take 1 tablet by mouth once daily  . Multiple Vitamin (MULTIVITAMIN) tablet Take 1 tablet by mouth daily.    . saw palmetto 160 MG capsule Take 160 mg by mouth 2 (two) times daily.     No facility-administered encounter medications on file as of 10/04/2016.     Allergies as of 10/04/2016  . (No Known Allergies)    Past Medical History:  Diagnosis Date  . Cataract   . Chronic kidney disease    kidney stone  . Diverticulosis of colon   . Hypertension     Past Surgical History:  Procedure Laterality Date  . CATARACT EXTRACTION W/ INTRAOCULAR LENS IMPLANT  04/2012   bilateral  . CYSTOSCOPY     kidney stones  . TONSILLECTOMY  1953    Family History  Problem Relation Age of Onset  . Alcohol abuse Father   . Heart disease Father     72 MI  . Hyperlipidemia Mother   . Osteoporosis Mother   . Colon cancer Neg Hx     Social History   Social History  . Marital status: Married    Spouse name: N/A  . Number of children: 5  . Years of education: N/A   Occupational History  . retired    Social History Main Topics  . Smoking status: Current Every Day Smoker    Packs/day: 1.00    Years: 50.00   Types: Cigarettes  . Smokeless tobacco: Never Used     Comment: currently smoking 2 packs per week (10/04/16)  . Alcohol use No     Comment: abstinent 20 years, AA  . Drug use: No  . Sexual activity: Not on file   Other Topics Concern  . Not on file   Social History Narrative   Married 1982 2nd marriage- 3 stepkids. First 1973-2 children. 7 grandchildren total.    Empty nest lives with wife.       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    Security at [employer].       Hobbies: former golfer-sparing now, time at [employer], travel     Review of systems: Review of Systems  Constitutional: Negative for fever and chills.  HENT: Negative.   Eyes: Negative for blurred vision.  Respiratory: as per HPI  Cardiovascular: Negative for chest pain and palpitations.  Gastrointestinal: Negative for vomiting, diarrhea, blood per rectum. Genitourinary: Negative for dysuria, urgency, frequency and hematuria.  Musculoskeletal: Negative for myalgias, back pain and joint pain.  Skin: Negative for itching and rash.  Neurological: Negative for dizziness, tremors, focal weakness, seizures and loss of consciousness.  Endo/Heme/Allergies:  Negative for environmental allergies.  Psychiatric/Behavioral: Negative for depression, suicidal ideas and hallucinations.  All other systems reviewed and are negative.   Physical Exam: Blood pressure 104/66, pulse 85, height 5\' 11"  (1.803 m), weight 227 lb 6.4 oz (103.1 kg), SpO2 91 %. Gen:      No acute distress HEENT:  EOMI, sclera anicteric Neck:     No masses; no thyromegaly Lungs:    Clear to auscultation bilaterally; normal respiratory effort CV:         Regular rate and rhythm; no murmurs Abd:      + bowel sounds; soft, non-tender; no palpable masses, no distension Ext:    No edema; adequate peripheral perfusion Skin:      Warm and dry; no rash Neuro: alert and oriented x 3 Psych: normal  mood and affect  Data Reviewed: Screening CT chest 08/29/16 Lung-RADS Category 2S. Images reviewed.  Assessment:  #1 COPD Trevor [name] likely has COPD based on his symptoms, smoking history and imaging. He has evidence of hyperinflation dating back to 2013. He has issues with dyspnea on exertion and may benefit from an inhaler. I'll start him on Bevespi and schedule for PFTs to get a better assessment of lung function.  #2 Smoking. He is a current smoker. I have encouraged him to work on smoking cessation. I'll readdress this at next visit.  #3 Lung nodules Sub cm pulmonary nodules. Continue with annual screening CT of chest.  Plan/Recommendations: - Start bevespi - PFTs. - Annual screening CT of chest.  Marshell Garfinkel MD [facility] Pager [phone] 10/04/2016, 3:26 PM  CC: Trevor Olp, MD

## 2016-10-29 DIAGNOSIS — F17210 Nicotine dependence, cigarettes, uncomplicated: Secondary | ICD-10-CM | POA: Diagnosis not present

## 2016-10-29 DIAGNOSIS — R531 Weakness: Secondary | ICD-10-CM | POA: Diagnosis not present

## 2016-10-29 DIAGNOSIS — W19XXXA Unspecified fall, initial encounter: Secondary | ICD-10-CM | POA: Diagnosis not present

## 2016-10-29 DIAGNOSIS — J449 Chronic obstructive pulmonary disease, unspecified: Secondary | ICD-10-CM | POA: Diagnosis not present

## 2016-10-29 DIAGNOSIS — R4182 Altered mental status, unspecified: Secondary | ICD-10-CM | POA: Diagnosis not present

## 2016-10-29 DIAGNOSIS — Z79899 Other long term (current) drug therapy: Secondary | ICD-10-CM | POA: Diagnosis not present

## 2016-10-29 DIAGNOSIS — I10 Essential (primary) hypertension: Secondary | ICD-10-CM | POA: Diagnosis not present

## 2016-11-11 ENCOUNTER — Other Ambulatory Visit: Payer: Self-pay | Admitting: Family Medicine

## 2016-11-16 ENCOUNTER — Other Ambulatory Visit: Payer: Self-pay

## 2016-11-16 ENCOUNTER — Ambulatory Visit (INDEPENDENT_AMBULATORY_CARE_PROVIDER_SITE_OTHER): Payer: Commercial Managed Care - HMO | Admitting: Pulmonary Disease

## 2016-11-16 DIAGNOSIS — J449 Chronic obstructive pulmonary disease, unspecified: Secondary | ICD-10-CM | POA: Diagnosis not present

## 2016-11-16 LAB — PULMONARY FUNCTION TEST
DL/VA % PRED: 88 %
DL/VA: 4.12 ml/min/mmHg/L
DLCO COR % PRED: 73 %
DLCO COR: 24.85 ml/min/mmHg
DLCO unc % pred: 74 %
DLCO unc: 25.2 ml/min/mmHg
FEF 25-75 Post: 1.23 L/sec
FEF 25-75 Pre: 0.81 L/sec
FEF2575-%CHANGE-POST: 51 %
FEF2575-%PRED-PRE: 31 %
FEF2575-%Pred-Post: 47 %
FEV1-%Change-Post: 16 %
FEV1-%PRED-PRE: 51 %
FEV1-%Pred-Post: 60 %
FEV1-Post: 2.05 L
FEV1-Pre: 1.76 L
FEV1FVC-%CHANGE-POST: 12 %
FEV1FVC-%Pred-Pre: 78 %
FEV6-%Change-Post: 4 %
FEV6-%PRED-PRE: 67 %
FEV6-%Pred-Post: 70 %
FEV6-PRE: 2.96 L
FEV6-Post: 3.08 L
FEV6FVC-%Change-Post: 0 %
FEV6FVC-%Pred-Post: 103 %
FEV6FVC-%Pred-Pre: 103 %
FVC-%Change-Post: 3 %
FVC-%PRED-POST: 67 %
FVC-%PRED-PRE: 65 %
FVC-POST: 3.13 L
FVC-PRE: 3.02 L
POST FEV6/FVC RATIO: 99 %
PRE FEV1/FVC RATIO: 58 %
Post FEV1/FVC ratio: 65 %
Pre FEV6/FVC Ratio: 98 %
RV % pred: 191 %
RV: 4.78 L
TLC % pred: 120 %
TLC: 8.72 L

## 2016-11-16 MED ORDER — GLYCOPYRROLATE-FORMOTEROL 9-4.8 MCG/ACT IN AERO: 2.0000 | INHALATION_SPRAY | Freq: Two times a day (BID) | RESPIRATORY_TRACT | 0 refills | Status: DC

## 2016-11-28 ENCOUNTER — Ambulatory Visit: Payer: Commercial Managed Care - HMO | Admitting: Pulmonary Disease

## 2016-12-01 ENCOUNTER — Ambulatory Visit (INDEPENDENT_AMBULATORY_CARE_PROVIDER_SITE_OTHER): Payer: Commercial Managed Care - HMO | Admitting: Pulmonary Disease

## 2016-12-01 ENCOUNTER — Encounter: Payer: Self-pay | Admitting: Pulmonary Disease

## 2016-12-01 VITALS — BP 114/64 | HR 67 | Ht 71.0 in | Wt 233.4 lb

## 2016-12-01 DIAGNOSIS — J449 Chronic obstructive pulmonary disease, unspecified: Secondary | ICD-10-CM | POA: Diagnosis not present

## 2016-12-01 DIAGNOSIS — F17210 Nicotine dependence, cigarettes, uncomplicated: Secondary | ICD-10-CM

## 2016-12-01 MED ORDER — FLUTICASONE-UMECLIDIN-VILANT 100-62.5-25 MCG/INH IN AEPB: 1.0000 | INHALATION_SPRAY | Freq: Every day | RESPIRATORY_TRACT | 0 refills | Status: DC

## 2016-12-01 MED ORDER — GLYCOPYRROLATE-FORMOTEROL 9-4.8 MCG/ACT IN AERO: 2.0000 | INHALATION_SPRAY | Freq: Two times a day (BID) | RESPIRATORY_TRACT | 0 refills | Status: DC

## 2016-12-01 NOTE — Progress Notes (Signed)
Trevor [name]    [id number]    12-Apr-1946  Primary Care Physician:Stephen Yong Channel, MD  Referring Physician: Marin Olp, MD [place], [place] [address]  Chief complaint:   Follow up for COPD GOLD B.  HPI: Trevor [name] is a 70 year old with active smoking, COPD GOLD B (CAT score 17, 0 exacerbations, FEV1 60%). He's complains of dyspnea on exertion for the past several years. He has chronic daily cough with white sputum production. Has never had pulmonary function tests or been on inhalers before. He had a recent screening CT of the chest and a chest x-ray which showed COPD and pulmonary nodules.  Outpatient Encounter Prescriptions as of 12/01/2016  Medication Sig  . aspirin 81 MG tablet Take 81 mg by mouth daily.    . Glycopyrrolate-Formoterol (BEVESPI AEROSPHERE) 9-4.8 MCG/ACT AERO Inhale 2 puffs into the lungs 2 (two) times daily.  Marland Kitchen lisinopril-hydrochlorothiazide (PRINZIDE,ZESTORETIC) 20-25 MG tablet take 1 tablet by mouth once daily  . Multiple Vitamin (MULTIVITAMIN) tablet Take 1 tablet by mouth daily.    . saw palmetto 160 MG capsule Take 160 mg by mouth 2 (two) times daily.     No facility-administered encounter medications on file as of 12/01/2016.     Allergies as of 12/01/2016  . (No Known Allergies)    Past Medical History:  Diagnosis Date  . Cataract   . Chronic kidney disease    kidney stone  . Diverticulosis of colon   . Hypertension     Past Surgical History:  Procedure Laterality Date  . CATARACT EXTRACTION W/ INTRAOCULAR LENS IMPLANT  04/2012   bilateral  . CYSTOSCOPY     kidney stones  . TONSILLECTOMY  1953    Family History  Problem Relation Age of Onset  . Alcohol abuse Father   . Heart disease Father     84 MI  . Hyperlipidemia Mother   . Osteoporosis Mother   . Colon cancer Neg Hx     Social History   Social History  . Marital status: Married    Spouse name: N/A  . Number of children: 5  . Years of  education: N/A   Occupational History  . retired    Social History Main Topics  . Smoking status: Current Every Day Smoker    Packs/day: 1.00    Years: 50.00    Types: Cigarettes  . Smokeless tobacco: Never Used     Comment: currently smoking 2 packs per week (10/04/16)  . Alcohol use No     Comment: abstinent 20 years, AA  . Drug use: No  . Sexual activity: Not on file   Other Topics Concern  . Not on file   Social History Narrative   Married 1982 2nd marriage- 3 stepkids. First 1973-2 children. 7 grandchildren total.    Empty nest lives with wife.       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    Security at [employer].       Hobbies: former golfer-sparing now, time at [employer], travel     Review of systems: Review of Systems  Constitutional: Negative for fever and chills.  HENT: Negative.   Eyes: Negative for blurred vision.  Respiratory: as per HPI  Cardiovascular: Negative for chest pain and palpitations.  Gastrointestinal: Negative for vomiting, diarrhea, blood per rectum. Genitourinary: Negative for dysuria, urgency, frequency and hematuria.  Musculoskeletal: Negative for myalgias, back pain and joint pain.  Skin: Negative for itching and rash.  Neurological: Negative for dizziness, tremors, focal weakness, seizures and loss of consciousness.  Endo/Heme/Allergies: Negative for environmental allergies.  Psychiatric/Behavioral: Negative for depression, suicidal ideas and hallucinations.  All other systems reviewed and are negative.   Physical Exam: Blood pressure 104/66, pulse 85, height 5\' 11"  (1.803 m), weight 227 lb 6.4 oz (103.1 kg), SpO2 91 %. Gen:      No acute distress HEENT:  EOMI, sclera anicteric Neck:     No masses; no thyromegaly Lungs:    Clear to auscultation bilaterally; normal respiratory effort CV:         Regular rate and rhythm; no murmurs Abd:      + bowel sounds; soft,  non-tender; no palpable masses, no distension Ext:    No edema; adequate peripheral perfusion Skin:      Warm and dry; no rash Neuro: alert and oriented x 3 Psych: normal mood and affect  Data Reviewed: Screening CT chest 08/29/16 Lung-RADS Category 2S. Images reviewed.  PFTs 11/16/16 FVC 2.13 [67%) FEV1 2.05 (60%) F/F 65 TLC 120% RV/TLC 152% DLCO 74% Moderate-severe obstruction with positive bronchodilator response  Assessment:  #1 COPD GOLD B He has responded well to bevespi still has some dyspnea on exertion. I reviewed his PFTs with him today. They do show moderate-severe obstruction. He has significant bronchodilator response and would benefit from further optimization of his inhalers. I'll switch him to Trelegy inhaler.  #2 Smoking. He is a current smoker. I have encouraged him to work on smoking cessation. He is confident that he can quit on his own. Time spent in counseling- 5 mins.  #3 Lung nodules Sub cm pulmonary nodules. Continue with annual screening CT of chest.  Plan/Recommendations: - Stop bevespi, start trelegy. - Smoking cessation. - Annual screening CT of chest.  Marshell Garfinkel MD [facility] Pager [phone] 12/01/2016, 3:21 PM  CC: Marin Olp, MD

## 2016-12-01 NOTE — Patient Instructions (Signed)
Stop the bevespi. Will start you on trelegy inhaler. Continue to work on smoking cessation  Return in 3 months.

## 2016-12-01 NOTE — Progress Notes (Signed)
Patient seen in the office today and instructed on use of Trelegy.  Patient expressed understanding and demonstrated technique.  Parke Poisson, CMA 12/01/16

## 2017-01-12 ENCOUNTER — Ambulatory Visit (INDEPENDENT_AMBULATORY_CARE_PROVIDER_SITE_OTHER): Payer: Medicare HMO | Admitting: Family Medicine

## 2017-01-12 ENCOUNTER — Encounter: Payer: Self-pay | Admitting: Family Medicine

## 2017-01-12 DIAGNOSIS — E785 Hyperlipidemia, unspecified: Secondary | ICD-10-CM | POA: Diagnosis not present

## 2017-01-12 DIAGNOSIS — F17200 Nicotine dependence, unspecified, uncomplicated: Secondary | ICD-10-CM | POA: Diagnosis not present

## 2017-01-12 DIAGNOSIS — I700 Atherosclerosis of aorta: Secondary | ICD-10-CM | POA: Diagnosis not present

## 2017-01-12 DIAGNOSIS — I10 Essential (primary) hypertension: Secondary | ICD-10-CM

## 2017-01-12 MED ORDER — ATORVASTATIN CALCIUM 10 MG PO TABS: 10.0000 mg | ORAL_TABLET | Freq: Every day | ORAL | 3 refills | Status: AC

## 2017-01-12 NOTE — Patient Instructions (Addendum)
Congratulations on quitting smoking! You can do this!   Start atorvastatin 10mg  to lower cholesterol given plaques noted on aorta and vessels of the heart to help prevent heart attack. If you get any muscle aches on this, you can stop for a week then restart every other day.   Mychart? declined

## 2017-01-12 NOTE — Progress Notes (Signed)
Subjective:  Trevor [name] is a 71 y.o. year old very pleasant male patient who presents for/with See problem oriented charting ROS- No chest pain. shortness of breath with inhaler from pulmonary. No headache or blurry vision.    Past Medical History-  Patient Active Problem List   Diagnosis Date Noted  . COPD, group B, by GOLD 2017 classification ([facility]) 12/01/2016    Priority: High  . TOBACCO USE 12/25/2009    Priority: High  . Aortic atherosclerosis ([facility]) 01/12/2017    Priority: Medium  . Hyperglycemia 04/29/2015    Priority: Medium  . BPH (benign prostatic hyperplasia) 12/03/2014    Priority: Medium  . History of alcohol abuse 12/03/2014    Priority: Medium  . Hyperlipidemia 09/09/2013    Priority: Medium  . Essential hypertension 07/31/2007    Priority: Medium  . Actinic keratosis 04/29/2015    Priority: Low  . Allergic rhinitis 12/03/2014    Priority: Low  . Erectile dysfunction 09/09/2013    Priority: Low  . Metabolic syndrome X [id number]    Priority: Low  . NEPHROLITHIASIS, HX OF 07/31/2007    Priority: Low    Medications- reviewed and updated Current Outpatient Prescriptions  Medication Sig Dispense Refill  . aspirin 81 MG tablet Take 81 mg by mouth daily.      . Fluticasone-Umeclidin-Vilant (TRELEGY ELLIPTA) 100-62.5-25 MCG/INH AEPB Inhale 1 puff into the lungs daily. 42 each 0  . lisinopril-hydrochlorothiazide (PRINZIDE,ZESTORETIC) 20-25 MG tablet take 1 tablet by mouth once daily 90 tablet 3  . Multiple Vitamin (MULTIVITAMIN) tablet Take 1 tablet by mouth daily.      . saw palmetto 160 MG capsule Take 160 mg by mouth 2 (two) times daily.       Objective: BP 123/82 (BP Location: Left Arm, Patient Position: Sitting, Cuff Size: Large)   Pulse 98   Ht 5\' 11"  (1.803 m)   Wt 234 lb 12.8 oz (106.5 kg)   SpO2 92%   BMI 32.75 kg/m  Gen: NAD, resting comfortably CV: RRR no murmurs rubs or gallops Lungs: CTAB no crackles, wheeze, rhonchi Abdomen: obese  Ext: no  edema Skin: warm, dry   Assessment/Plan:  TOBACCO USE Has quit smoking since finding out about copd- only 7 days in though. Provided a lot of encouragement today- the upcoming weekend at work will be hard for him.   Aortic atherosclerosis ([facility]) S: aortic atherosclerosis and Also 3 vessel CAD noted on lung cancer screening CT. We discussed with these findings would push for LDL goal under 70 A/P: also discussed importance of BP control in addition to lipids. bp doing well. Monitor both.    Hyperlipidemia S: poorly controlled on no statin considering aortic atherosclerosis and potential cad findings. No myalgias.  Lab Results  Component Value Date   CHOL 175 06/28/2016   HDL 74.00 06/28/2016   LDLCALC 88 06/28/2016   TRIG 68.0 06/28/2016   CHOLHDL 2 06/28/2016   A/P: start atorvastatin 10mg    Essential hypertension S: controlled on Lisinopril-hctz 20-25mg . .  BP Readings from Last 3 Encounters:  01/12/17 123/82  12/01/16 114/64  10/04/16 104/66  A/P:Continue current medications    Return in about 6 months (around 07/12/2017) for physical, come fasting and we will update labs.  Meds ordered this encounter  Medications  . atorvastatin (LIPITOR) 10 MG tablet    Sig: Take 1 tablet (10 mg total) by mouth daily.    Dispense:  90 tablet    Refill:  3  Return precautions advised.  Garret Reddish, MD

## 2017-01-12 NOTE — Assessment & Plan Note (Signed)
S: aortic atherosclerosis and Also 3 vessel CAD noted on lung cancer screening CT. We discussed with these findings would push for LDL goal under 70 A/P: also discussed importance of BP control in addition to lipids. bp doing well. Monitor both.

## 2017-01-12 NOTE — Assessment & Plan Note (Signed)
S: controlled on Lisinopril-hctz 20-25mg . .  BP Readings from Last 3 Encounters:  01/12/17 123/82  12/01/16 114/64  10/04/16 104/66  A/P:Continue current medications

## 2017-01-12 NOTE — Progress Notes (Signed)
Pre visit review using our clinic review tool, if applicable. No additional management support is needed unless otherwise documented below in the visit note. 

## 2017-01-12 NOTE — Assessment & Plan Note (Signed)
S: poorly controlled on no statin considering aortic atherosclerosis and potential cad findings. No myalgias.  Lab Results  Component Value Date   CHOL 175 06/28/2016   HDL 74.00 06/28/2016   LDLCALC 88 06/28/2016   TRIG 68.0 06/28/2016   CHOLHDL 2 06/28/2016   A/P: start atorvastatin 10mg 

## 2017-01-12 NOTE — Assessment & Plan Note (Signed)
Has quit smoking since finding out about copd- only 7 days in though. Provided a lot of encouragement today- the upcoming weekend at work will be hard for him.

## 2017-01-19 ENCOUNTER — Telehealth: Payer: Self-pay | Admitting: Pulmonary Disease

## 2017-01-19 MED ORDER — FLUTICASONE-UMECLIDIN-VILANT 100-62.5-25 MCG/INH IN AEPB: 1.0000 | INHALATION_SPRAY | Freq: Every day | RESPIRATORY_TRACT | 5 refills | Status: DC

## 2017-01-19 NOTE — Telephone Encounter (Signed)
Patient returning call - He can be reached at [phone] -pr

## 2017-01-19 NOTE — Telephone Encounter (Signed)
Attempted to contact pt. No answer, no option to leave a message. Will try back.  

## 2017-01-19 NOTE — Telephone Encounter (Signed)
[phone] pt calling back

## 2017-01-19 NOTE — Telephone Encounter (Signed)
[id number] x1 

## 2017-01-19 NOTE — Telephone Encounter (Signed)
Spoke with pt. He needs a prescription for Trelegy sent to his pharmacy. Rx has been sent in. Nothing further was needed.

## 2017-01-19 NOTE — Telephone Encounter (Signed)
Spoke with pt's spouse, who states trelegy is $242 dollars and pt can not afford this. I have advised pt's spouse to contact pt's insurance for a medication formulary. Diane is aware to contact our office once a formulary has been received.  Diane voiced her understanding and had no further questions. Nothing further needed at this time.

## 2017-01-23 ENCOUNTER — Telehealth: Payer: Self-pay | Admitting: Pulmonary Disease

## 2017-01-23 NOTE — Telephone Encounter (Signed)
lmtcb x1 for pt. 

## 2017-01-24 NOTE — Telephone Encounter (Signed)
LMTC x `1 for pt 

## 2017-01-24 NOTE — Telephone Encounter (Signed)
Patient returned call [phone].

## 2017-01-24 NOTE — Telephone Encounter (Signed)
Spoke with pt, states that his Trelegy is too expensive and needs something cheaper (over $200 for 30 day supply).   Called and spoke with pharmacist, states that this inhaler is covered by his insurance but he has a deductible that he must meet before any meds will be covered.  Pharmacist verified that this will be the case for any brand name inhaler.   Spoke with pt, aware of deductible for medications.  I offered sample for patient, but he declined.  Nothing further needed at this time.

## 2017-02-09 DIAGNOSIS — H52209 Unspecified astigmatism, unspecified eye: Secondary | ICD-10-CM | POA: Diagnosis not present

## 2017-02-09 DIAGNOSIS — H26492 Other secondary cataract, left eye: Secondary | ICD-10-CM | POA: Diagnosis not present

## 2017-02-09 DIAGNOSIS — I10 Essential (primary) hypertension: Secondary | ICD-10-CM | POA: Diagnosis not present

## 2017-03-02 ENCOUNTER — Ambulatory Visit (INDEPENDENT_AMBULATORY_CARE_PROVIDER_SITE_OTHER): Payer: Medicare HMO | Admitting: Pulmonary Disease

## 2017-03-02 ENCOUNTER — Encounter: Payer: Self-pay | Admitting: Pulmonary Disease

## 2017-03-02 VITALS — BP 122/76 | HR 92 | Wt 235.4 lb

## 2017-03-02 DIAGNOSIS — J449 Chronic obstructive pulmonary disease, unspecified: Secondary | ICD-10-CM | POA: Diagnosis not present

## 2017-03-02 MED ORDER — GLYCOPYRROLATE-FORMOTEROL 9-4.8 MCG/ACT IN AERO: 2.0000 | INHALATION_SPRAY | Freq: Two times a day (BID) | RESPIRATORY_TRACT | 0 refills | Status: AC

## 2017-03-02 NOTE — Patient Instructions (Signed)
Stop taking Trelegy Start taking Bevespi 2 puffs twice daily Please call sooner if needed  Follow up in 3 months with Dr. Vaughan Browner

## 2017-03-02 NOTE — Progress Notes (Signed)
Trevor [name]    [id number]    1946-05-02  Primary Care Physician:Trevor Yong Channel, MD  Referring Physician: Marin Olp, MD [place], [place] [address]  Chief complaint:   Follow up for COPD GOLD B.  HPI: Trevor [name] is a 71 year old with active smoking, COPD GOLD B (CAT score 17, 0 exacerbations, FEV1 60%). He's complains of dyspnea on exertion for the past several years. He has chronic daily cough with white sputum production. Has never had pulmonary function tests or been on inhalers before. He had a recent screening CT of the chest and a chest x-ray which showed COPD and pulmonary nodules.  Interim History: Feels well. Could not afford the telegy due to co pay and is now back on bevespi. He does not notice the difference between the two inhalers.   Outpatient Encounter Prescriptions as of 03/02/2017  Medication Sig  . aspirin 81 MG tablet Take 81 mg by mouth daily.    Marland Kitchen atorvastatin (LIPITOR) 10 MG tablet Take 1 tablet (10 mg total) by mouth daily.  . Fluticasone-Umeclidin-Vilant (TRELEGY ELLIPTA) 100-62.5-25 MCG/INH AEPB Inhale 1 puff into the lungs daily.  Marland Kitchen lisinopril-hydrochlorothiazide (PRINZIDE,ZESTORETIC) 20-25 MG tablet take 1 tablet by mouth once daily  . Multiple Vitamin (MULTIVITAMIN) tablet Take 1 tablet by mouth daily.    . saw palmetto 160 MG capsule Take 160 mg by mouth 2 (two) times daily.     No facility-administered encounter medications on file as of 03/02/2017.     Allergies as of 03/02/2017  . (No Known Allergies)    Past Medical History:  Diagnosis Date  . Cataract   . Chronic kidney disease    kidney stone  . Diverticulosis of colon   . Hypertension     Past Surgical History:  Procedure Laterality Date  . CATARACT EXTRACTION W/ INTRAOCULAR LENS IMPLANT  04/2012   bilateral  . CYSTOSCOPY     kidney stones  . TONSILLECTOMY  1953    Family History  Problem Relation Age of Onset  . Alcohol abuse Father   .  Heart disease Father     43 MI  . Hyperlipidemia Mother   . Osteoporosis Mother   . Colon cancer Neg Hx     Social History   Social History  . Marital status: Married    Spouse name: N/A  . Number of children: 5  . Years of education: N/A   Occupational History  . retired    Social History Main Topics  . Smoking status: Former Smoker    Packs/day: 1.00    Years: 50.00    Types: Cigarettes  . Smokeless tobacco: Never Used     Comment: currently smoking 2 packs per week (10/04/16)  . Alcohol use No     Comment: abstinent 20 years, AA  . Drug use: No  . Sexual activity: Not on file   Other Topics Concern  . Not on file   Social History Narrative   Married 1982 2nd marriage- 3 stepkids. First 1973-2 children. 7 grandchildren total.    Empty nest lives with wife.       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    Security at [employer].       Hobbies: former golfer-sparing now, time at [employer], travel   Review of systems: Review of Systems  Constitutional: Negative for fever and chills.  HENT: Negative.   Eyes: Negative for blurred vision.  Respiratory: as per HPI  Cardiovascular: Negative for chest pain and palpitations.  Gastrointestinal: Negative for vomiting, diarrhea, blood per rectum. Genitourinary: Negative for dysuria, urgency, frequency and hematuria.  Musculoskeletal: Negative for myalgias, back pain and joint pain.  Skin: Negative for itching and rash.  Neurological: Negative for dizziness, tremors, focal weakness, seizures and loss of consciousness.  Endo/Heme/Allergies: Negative for environmental allergies.  Psychiatric/Behavioral: Negative for depression, suicidal ideas and hallucinations.  All other systems reviewed and are negative.  Physical Exam: Blood pressure 122/76, pulse 92, weight 235 lb 6.4 oz (106.8 kg), SpO2 93 %. Gen:      No acute distress HEENT:  EOMI, sclera  anicteric Neck:     No masses; no thyromegaly Lungs:    Clear to auscultation bilaterally; normal respiratory effort CV:         Regular rate and rhythm; no murmurs Abd:      + bowel sounds; soft, non-tender; no palpable masses, no distension Ext:    No edema; adequate peripheral perfusion Skin:      Warm and dry; no rash Neuro: alert and oriented x 3 Psych: normal mood and affect  Data Reviewed: Screening CT chest 08/29/16 Lung-RADS Category 2S. Images reviewed.  PFTs 11/16/16 FVC 2.13 [67%) FEV1 2.05 (60%) F/F 65 TLC 120% RV/TLC 152% DLCO 74% Moderate-severe obstruction with positive bronchodilator response  Assessment:  #1 COPD GOLD B He has responded well to bevespi in past, cannot afford trelegy and want to go back to bevespi. We will make the switch. He will get a list of inhalers that are covered by his insurance and will let us know.  #2 Smoking. Quit smoking 1 week ago. I congratulated him and encouraged him to keep off cigarettes  #3 Lung nodules Sub cm pulmonary nodules. Continue with annual screening CT of chest.  Plan/Recommendations: - Stop trelegy, start bevespi. - Annual screening CT of chest.  Marshell Garfinkel MD [facility] Pager [phone] 03/02/2017, 3:27 PM  CC: Trevor Olp, MD

## 2017-03-02 NOTE — Addendum Note (Signed)
Addended by: Jannette Spanner on: 03/02/2017 03:52 PM   Modules accepted: Orders

## 2017-04-05 DIAGNOSIS — Z961 Presence of intraocular lens: Secondary | ICD-10-CM | POA: Diagnosis not present

## 2017-04-05 DIAGNOSIS — H26493 Other secondary cataract, bilateral: Secondary | ICD-10-CM | POA: Diagnosis not present

## 2017-04-05 DIAGNOSIS — H43811 Vitreous degeneration, right eye: Secondary | ICD-10-CM | POA: Diagnosis not present

## 2017-04-05 DIAGNOSIS — H527 Unspecified disorder of refraction: Secondary | ICD-10-CM | POA: Diagnosis not present

## 2017-05-04 ENCOUNTER — Telehealth: Payer: Self-pay | Admitting: Pulmonary Disease

## 2017-05-04 MED ORDER — FLUTICASONE-UMECLIDIN-VILANT 100-62.5-25 MCG/INH IN AEPB: 1.0000 | INHALATION_SPRAY | Freq: Every day | RESPIRATORY_TRACT | 0 refills | Status: DC

## 2017-05-04 NOTE — Telephone Encounter (Signed)
Spoke with pt and informed that we have PA form and will DR Mannam sign when he is back in office.  Pt states that he tried Bevespi samples at last ov but doesn't work as good as Theme [employer].  Samples of Trelegy left at front desk.

## 2017-05-18 NOTE — Telephone Encounter (Signed)
PA has been signed and faxed to provided number. Will await determination. Will leave in my box to f/u on.

## 2017-05-25 NOTE — Telephone Encounter (Signed)
Pa has been denied, no alternatives were given. I have attempted to contact Medicare for alternatives and was placed on a long hold. Will call back.

## 2017-05-29 ENCOUNTER — Other Ambulatory Visit: Payer: Self-pay

## 2017-05-29 ENCOUNTER — Telehealth: Payer: Self-pay

## 2017-05-29 NOTE — Telephone Encounter (Addendum)
Spoke with Estill Bamberg with Medicare, who states Trelegy is covered with a co pay of $242 monthly, due to patient being in his deductible stage. Estill Bamberg states patients second month co pay will be $ 47 dollars, due to meeting his deductible.  Lm to make pt aware of this.l

## 2017-05-29 NOTE — Telephone Encounter (Signed)
Contacted patient to ensure he received information from 05/29/2017 telephone note. The information was given to patient again who verbalized he understood he would have to meet his deductible prior to insurance covering inhaler. He stated he had 2 weeks left on sample inhaler and would contact us when he needed a prescription. Nothing further is needed.

## 2017-06-08 MED ORDER — FLUTICASONE-UMECLIDIN-VILANT 100-62.5-25 MCG/INH IN AEPB
1.0000 | INHALATION_SPRAY | Freq: Every day | RESPIRATORY_TRACT | 6 refills | Status: AC
Start: 2017-06-08 — End: ?

## 2017-06-08 NOTE — Telephone Encounter (Signed)
Pt calling wanting rx called in to [facility] in [place] for trelegy,please advise.[name]

## 2017-06-08 NOTE — Telephone Encounter (Signed)
Pt is aware of below message and voiced his understanding. Pt request Rx for trelegy to be sent to [facility]. Rx has been sent to preferred pharmacy. Nothing further needed.

## 2017-07-03 ENCOUNTER — Ambulatory Visit: Payer: Medicare HMO | Admitting: Pulmonary Disease

## 2017-07-20 ENCOUNTER — Ambulatory Visit: Payer: Medicare HMO | Admitting: Pulmonary Disease

## 2017-07-24 ENCOUNTER — Other Ambulatory Visit: Payer: Medicare HMO

## 2017-07-31 ENCOUNTER — Encounter: Payer: Medicare HMO | Admitting: Family Medicine

## 2017-11-08 ENCOUNTER — Other Ambulatory Visit: Payer: Self-pay

## 2017-11-08 MED ORDER — LISINOPRIL-HYDROCHLOROTHIAZIDE 20-25 MG PO TABS: 1.0000 | ORAL_TABLET | Freq: Every day | ORAL | 0 refills | Status: DC

## 2018-01-19 ENCOUNTER — Other Ambulatory Visit: Payer: Self-pay | Admitting: Pulmonary Disease

## 2018-02-02 ENCOUNTER — Other Ambulatory Visit: Payer: Self-pay

## 2018-02-02 MED ORDER — LISINOPRIL-HYDROCHLOROTHIAZIDE 20-25 MG PO TABS: 1.0000 | ORAL_TABLET | Freq: Every day | ORAL | 0 refills | Status: DC

## 2018-05-07 ENCOUNTER — Other Ambulatory Visit: Payer: Self-pay | Admitting: Family Medicine

## 2018-05-15 ENCOUNTER — Other Ambulatory Visit: Payer: Self-pay | Admitting: Family Medicine
